# Patient Record
Sex: Male | Born: 1950 | Race: Black or African American | Hispanic: No | Smoking: Former smoker
Health system: Southern US, Community
[De-identification: ages and names within clinical notes are randomized; demographics above are authoritative.]

## PROBLEM LIST (undated history)

## (undated) DIAGNOSIS — I639 Cerebral infarction, unspecified: Secondary | ICD-10-CM

## (undated) DIAGNOSIS — E119 Type 2 diabetes mellitus without complications: Secondary | ICD-10-CM

## (undated) DIAGNOSIS — I10 Essential (primary) hypertension: Secondary | ICD-10-CM

---

## 2016-08-05 ENCOUNTER — Emergency Department (HOSPITAL_COMMUNITY): Payer: Medicare Other

## 2016-08-05 ENCOUNTER — Observation Stay (HOSPITAL_COMMUNITY)
Admission: EM | Admit: 2016-08-05 | Discharge: 2016-08-06 | Disposition: A | Discharge status: Home / Self Care | Payer: Medicare Other | Source: Home / Self Care | Attending: Emergency Medicine | Admitting: Emergency Medicine

## 2016-08-05 DIAGNOSIS — N189 Chronic kidney disease, unspecified: Secondary | ICD-10-CM | POA: Diagnosis present

## 2016-08-05 DIAGNOSIS — I639 Cerebral infarction, unspecified: Secondary | ICD-10-CM

## 2016-08-05 DIAGNOSIS — Z87891 Personal history of nicotine dependence: Secondary | ICD-10-CM

## 2016-08-05 DIAGNOSIS — R4701 Aphasia: Secondary | ICD-10-CM

## 2016-08-05 DIAGNOSIS — R29810 Facial weakness: Secondary | ICD-10-CM | POA: Diagnosis present

## 2016-08-05 DIAGNOSIS — D631 Anemia in chronic kidney disease: Secondary | ICD-10-CM | POA: Diagnosis present

## 2016-08-05 DIAGNOSIS — G9341 Metabolic encephalopathy: Secondary | ICD-10-CM | POA: Diagnosis present

## 2016-08-05 DIAGNOSIS — Z79899 Other long term (current) drug therapy: Secondary | ICD-10-CM

## 2016-08-05 DIAGNOSIS — R0602 Shortness of breath: Secondary | ICD-10-CM

## 2016-08-05 DIAGNOSIS — E1122 Type 2 diabetes mellitus with diabetic chronic kidney disease: Secondary | ICD-10-CM | POA: Diagnosis present

## 2016-08-05 DIAGNOSIS — E785 Hyperlipidemia, unspecified: Secondary | ICD-10-CM | POA: Diagnosis present

## 2016-08-05 DIAGNOSIS — I509 Heart failure, unspecified: Secondary | ICD-10-CM | POA: Diagnosis present

## 2016-08-05 DIAGNOSIS — I69351 Hemiplegia and hemiparesis following cerebral infarction affecting right dominant side: Secondary | ICD-10-CM

## 2016-08-05 DIAGNOSIS — I130 Hypertensive heart and chronic kidney disease with heart failure and stage 1 through stage 4 chronic kidney disease, or unspecified chronic kidney disease: Secondary | ICD-10-CM | POA: Diagnosis present

## 2016-08-05 DIAGNOSIS — G8194 Hemiplegia, unspecified affecting left nondominant side: Secondary | ICD-10-CM | POA: Diagnosis present

## 2016-08-05 DIAGNOSIS — D72829 Elevated white blood cell count, unspecified: Secondary | ICD-10-CM | POA: Diagnosis present

## 2016-08-05 DIAGNOSIS — E1165 Type 2 diabetes mellitus with hyperglycemia: Secondary | ICD-10-CM | POA: Diagnosis present

## 2016-08-05 HISTORY — DX: Cerebral infarction, unspecified: I63.9

## 2016-08-05 LAB — I-STAT CHEM 8, ED
BUN: 28 mg/dL — ABNORMAL HIGH (ref 6–20)
CALCIUM ION: 1.14 mmol/L — AB (ref 1.15–1.40)
Chloride: 106 mmol/L (ref 101–111)
Creatinine, Ser: 1.4 mg/dL — ABNORMAL HIGH (ref 0.61–1.24)
Glucose, Bld: 114 mg/dL — ABNORMAL HIGH (ref 65–99)
HCT: 28 % — ABNORMAL LOW (ref 39.0–52.0)
HEMOGLOBIN: 9.5 g/dL — AB (ref 13.0–17.0)
POTASSIUM: 4.4 mmol/L (ref 3.5–5.1)
Sodium: 140 mmol/L (ref 135–145)
TCO2: 25 mmol/L (ref 0–100)

## 2016-08-05 LAB — CBG MONITORING, ED: Glucose-Capillary: 107 mg/dL — ABNORMAL HIGH (ref 65–99)

## 2016-08-05 MED ORDER — IOPAMIDOL (ISOVUE-370) INJECTION 76%
INTRAVENOUS | Status: DC
Start: 2016-08-05 — End: 2016-08-06
  Filled 2016-08-05: qty 50

## 2016-08-05 NOTE — ED Provider Notes (Addendum)
[facility]-EMERGENCY DEPT Provider Note   CSN: [id number] Arrival date & time: 08/05/16  2344  By signing my name below, I, Elder Negus, attest that this documentation has been prepared under the direction and in the presence of Geoffery Lyons, MD. Electronically Signed: Elder Negus, Scribe. 08/05/16. 11:58 PM.   History   Chief Complaint No chief complaint on file.  LEVEL 5 CAVEAT DUE TO APHASIA  HPI Davis Ambrosini is a 66 y.o. male with history of R sided hemiparesis at baseline who presents to the ED as a CODE STROKE from skilled nursing facility. According to medic report, this patient first developed "drooling" at his nursing facility at 1700. At 2230 he also had onset of speech problem as well as L arm weakness and L sided facial droop according to nursing staff. Medics state that the patient is able to answer yes/no questions; not his baseline. Also they do not visualize a L sided facial droop.   A complete history is limited by altered mental status.   The history is provided by the nursing home and the EMS personnel. No language interpreter was used.    No past medical history on file.  There are no active problems to display for this patient.   No past surgical history on file.     Home Medications    Prior to Admission medications   Not on File    Family History No family history on file.  Social History Social History  Substance Use Topics  . Smoking status: Not on file  . Smokeless tobacco: Not on file  . Alcohol use Not on file     Allergies   Patient has no allergy information on record.   Review of Systems Review of Systems A complete review of systems is unattainable due to altered mental status.   Physical Exam Updated Vital Signs There were no vitals taken for this visit.  Physical Exam   ED Treatments / Results  Labs (all labs ordered are listed, but only abnormal results are displayed) Labs Reviewed  CBG MONITORING, ED -  Abnormal; Notable for the following:       Result Value   Glucose-Capillary 107 (*)    All other components within normal limits  PROTIME-INR  APTT  CBC  DIFFERENTIAL  COMPREHENSIVE METABOLIC PANEL  I-STAT TROPOININ, ED  I-STAT CHEM 8, ED    EKG  EKG Interpretation  Date/Time:  Thursday August 06 2016 00:24:49 EDT Ventricular Rate:  100 PR Interval:    QRS Duration: 100 QT Interval:  361 QTC Calculation: 466 R Axis:   70 Text Interpretation:  Sinus tachycardia Inferior infarct, old Confirmed by Mohid Furuya  MD, Jaiveon Suppes ([id number]) on 08/06/2016 12:38:02 AM       Radiology No results found.  Procedures Procedures (including critical care time)  Medications Ordered in ED Medications - No data to display   Initial Impression / Assessment and Plan / ED Course  I have reviewed the triage vital signs and the nursing notes.  Pertinent labs & imaging results that were available during my care of the patient were reviewed by me and considered in my medical decision making (see chart for details).  Patient is a 66 year old male with past medical history of prior CVA with resultant right-sided hemiparesis. He presents today for evaluation of slurred speech and left arm weakness. He is currently a resident of a skilled nursing facility and this is where he was when the symptoms began. This was noted just prior to  arrival.  The patient arrived here as a code stroke due to the nature of symptoms and the timing of onset. He was screened upon arrival, then sent for a stat head CT. This was performed and revealed prior CVAs, however nothing obviously acute. He was also evaluated by neurology who wanted additional perfusion studies which were performed. Neurology is recommending admission to the hospitalist service for further workup of what appears to be a small CVA. Dr. Maryfrances Bunnell to admit.  Final Clinical Impressions(s) / ED Diagnoses   Final diagnoses:  None    New Prescriptions New  Prescriptions   No medications on file     Geoffery Lyons, MD 08/06/16 1610    Geoffery Lyons, MD 09/24/16 [id number]

## 2016-08-06 ENCOUNTER — Inpatient Hospital Stay (HOSPITAL_COMMUNITY)
Admission: EM | Admit: 2016-08-06 | Discharge: 2016-08-11 | DRG: 064 | Disposition: A | Discharge status: Skilled Nursing Facility | Payer: Medicare Other | Attending: Family Medicine | Admitting: Family Medicine

## 2016-08-06 ENCOUNTER — Observation Stay (HOSPITAL_COMMUNITY): Payer: Medicare Other

## 2016-08-06 ENCOUNTER — Encounter (HOSPITAL_COMMUNITY): Payer: Self-pay

## 2016-08-06 ENCOUNTER — Emergency Department (HOSPITAL_COMMUNITY): Payer: Medicare Other

## 2016-08-06 DIAGNOSIS — E784 Other hyperlipidemia: Secondary | ICD-10-CM | POA: Diagnosis not present

## 2016-08-06 DIAGNOSIS — I69351 Hemiplegia and hemiparesis following cerebral infarction affecting right dominant side: Secondary | ICD-10-CM | POA: Diagnosis not present

## 2016-08-06 DIAGNOSIS — Z79899 Other long term (current) drug therapy: Secondary | ICD-10-CM | POA: Diagnosis not present

## 2016-08-06 DIAGNOSIS — I639 Cerebral infarction, unspecified: Principal | ICD-10-CM

## 2016-08-06 DIAGNOSIS — I130 Hypertensive heart and chronic kidney disease with heart failure and stage 1 through stage 4 chronic kidney disease, or unspecified chronic kidney disease: Secondary | ICD-10-CM | POA: Diagnosis present

## 2016-08-06 DIAGNOSIS — G9341 Metabolic encephalopathy: Secondary | ICD-10-CM | POA: Diagnosis present

## 2016-08-06 DIAGNOSIS — I6312 Cerebral infarction due to embolism of basilar artery: Secondary | ICD-10-CM

## 2016-08-06 DIAGNOSIS — N179 Acute kidney failure, unspecified: Secondary | ICD-10-CM

## 2016-08-06 DIAGNOSIS — E785 Hyperlipidemia, unspecified: Secondary | ICD-10-CM

## 2016-08-06 DIAGNOSIS — I509 Heart failure, unspecified: Secondary | ICD-10-CM | POA: Diagnosis present

## 2016-08-06 DIAGNOSIS — R05 Cough: Secondary | ICD-10-CM

## 2016-08-06 DIAGNOSIS — I69359 Hemiplegia and hemiparesis following cerebral infarction affecting unspecified side: Secondary | ICD-10-CM

## 2016-08-06 DIAGNOSIS — D631 Anemia in chronic kidney disease: Secondary | ICD-10-CM | POA: Diagnosis present

## 2016-08-06 DIAGNOSIS — I6300 Cerebral infarction due to thrombosis of unspecified precerebral artery: Secondary | ICD-10-CM | POA: Diagnosis not present

## 2016-08-06 DIAGNOSIS — N189 Chronic kidney disease, unspecified: Secondary | ICD-10-CM

## 2016-08-06 DIAGNOSIS — E1165 Type 2 diabetes mellitus with hyperglycemia: Secondary | ICD-10-CM | POA: Diagnosis present

## 2016-08-06 DIAGNOSIS — R4701 Aphasia: Secondary | ICD-10-CM | POA: Diagnosis present

## 2016-08-06 DIAGNOSIS — I6789 Other cerebrovascular disease: Secondary | ICD-10-CM | POA: Diagnosis not present

## 2016-08-06 DIAGNOSIS — R1110 Vomiting, unspecified: Secondary | ICD-10-CM

## 2016-08-06 DIAGNOSIS — R059 Cough, unspecified: Secondary | ICD-10-CM

## 2016-08-06 DIAGNOSIS — Z87891 Personal history of nicotine dependence: Secondary | ICD-10-CM | POA: Diagnosis not present

## 2016-08-06 DIAGNOSIS — G8194 Hemiplegia, unspecified affecting left nondominant side: Secondary | ICD-10-CM | POA: Diagnosis present

## 2016-08-06 DIAGNOSIS — D72829 Elevated white blood cell count, unspecified: Secondary | ICD-10-CM | POA: Diagnosis present

## 2016-08-06 DIAGNOSIS — E1122 Type 2 diabetes mellitus with diabetic chronic kidney disease: Secondary | ICD-10-CM | POA: Diagnosis present

## 2016-08-06 DIAGNOSIS — R29810 Facial weakness: Secondary | ICD-10-CM | POA: Diagnosis present

## 2016-08-06 DIAGNOSIS — N289 Disorder of kidney and ureter, unspecified: Secondary | ICD-10-CM

## 2016-08-06 HISTORY — DX: Essential (primary) hypertension: I10

## 2016-08-06 HISTORY — DX: Cerebral infarction, unspecified: I63.9

## 2016-08-06 HISTORY — DX: Type 2 diabetes mellitus without complications: E11.9

## 2016-08-06 LAB — DIFFERENTIAL
Basophils Absolute: 0 10*3/uL (ref 0.0–0.1)
Basophils Relative: 0 %
Eosinophils Absolute: 0.4 10*3/uL (ref 0.0–0.7)
Eosinophils Relative: 2 %
Lymphocytes Relative: 12 %
Lymphs Abs: 1.9 10*3/uL (ref 0.7–4.0)
MONO ABS: 1.3 10*3/uL — AB (ref 0.1–1.0)
Monocytes Relative: 9 %
NEUTROS ABS: 11.9 10*3/uL — AB (ref 1.7–7.7)
Neutrophils Relative %: 77 %

## 2016-08-06 LAB — CBC
HCT: 29.3 % — ABNORMAL LOW (ref 39.0–52.0)
Hemoglobin: 9.2 g/dL — ABNORMAL LOW (ref 13.0–17.0)
MCH: 26.6 pg (ref 26.0–34.0)
MCHC: 31.4 g/dL (ref 30.0–36.0)
MCV: 84.7 fL (ref 78.0–100.0)
PLATELETS: 300 10*3/uL (ref 150–400)
RBC: 3.46 MIL/uL — AB (ref 4.22–5.81)
RDW: 15 % (ref 11.5–15.5)
WBC: 15.5 10*3/uL — AB (ref 4.0–10.5)

## 2016-08-06 LAB — PROTIME-INR
INR: 1.14
PROTHROMBIN TIME: 14.7 s (ref 11.4–15.2)

## 2016-08-06 LAB — COMPREHENSIVE METABOLIC PANEL
ALBUMIN: 3.4 g/dL — AB (ref 3.5–5.0)
ALK PHOS: 57 U/L (ref 38–126)
ALT: 13 U/L — AB (ref 17–63)
AST: 19 U/L (ref 15–41)
Anion gap: 8 (ref 5–15)
BUN: 26 mg/dL — ABNORMAL HIGH (ref 6–20)
CALCIUM: 9.2 mg/dL (ref 8.9–10.3)
CO2: 23 mmol/L (ref 22–32)
CREATININE: 1.4 mg/dL — AB (ref 0.61–1.24)
Chloride: 106 mmol/L (ref 101–111)
GFR calc non Af Amer: 54 mL/min — ABNORMAL LOW (ref >60)
GLUCOSE: 115 mg/dL — AB (ref 65–99)
Potassium: 4.4 mmol/L (ref 3.5–5.1)
SODIUM: 137 mmol/L (ref 135–145)
Total Bilirubin: 0.5 mg/dL (ref 0.3–1.2)
Total Protein: 7.5 g/dL (ref 6.5–8.1)

## 2016-08-06 LAB — I-STAT CG4 LACTIC ACID, ED: Lactic Acid, Venous: 1.48 mmol/L (ref 0.5–1.9)

## 2016-08-06 LAB — I-STAT TROPONIN, ED: Troponin i, poc: 0 ng/mL (ref 0.00–0.08)

## 2016-08-06 LAB — APTT: aPTT: 42 seconds — ABNORMAL HIGH (ref 24–36)

## 2016-08-06 LAB — MRSA PCR SCREENING: MRSA by PCR: NEGATIVE

## 2016-08-06 MED ORDER — SODIUM CHLORIDE 0.9 % IV SOLN: INTRAVENOUS | Status: DC

## 2016-08-06 MED ORDER — ACETAMINOPHEN 650 MG RE SUPP
650.0000 mg | RECTAL | Status: DC | PRN
  Administered 2016-08-09: 650 mg via RECTAL
  Filled 2016-08-06: qty 1

## 2016-08-06 MED ORDER — ACETAMINOPHEN 325 MG PO TABS: 650.0000 mg | ORAL_TABLET | ORAL | Status: DC | PRN

## 2016-08-06 MED ORDER — IOPAMIDOL (ISOVUE-370) INJECTION 76%
INTRAVENOUS | Status: AC
  Administered 2016-08-06: 100 mL
  Filled 2016-08-06: qty 50

## 2016-08-06 MED ORDER — SENNOSIDES-DOCUSATE SODIUM 8.6-50 MG PO TABS
1.0000 | ORAL_TABLET | Freq: Every evening | ORAL | Status: DC | PRN
  Filled 2016-08-06: qty 1

## 2016-08-06 MED ORDER — ENOXAPARIN SODIUM 40 MG/0.4ML ~~LOC~~ SOLN: 40.0000 mg | SUBCUTANEOUS | Status: DC

## 2016-08-06 MED ORDER — CHLORHEXIDINE GLUCONATE 0.12 % MT SOLN
15.0000 mL | Freq: Two times a day (BID) | OROMUCOSAL | Status: DC
  Administered 2016-08-07 – 2016-08-11 (×9): 15 mL via OROMUCOSAL
  Filled 2016-08-06 (×7): qty 15

## 2016-08-06 MED ORDER — ACETAMINOPHEN 160 MG/5ML PO SOLN: 650.0000 mg | ORAL | Status: DC | PRN

## 2016-08-06 MED ORDER — ASPIRIN 325 MG PO TABS: 325.0000 mg | ORAL_TABLET | Freq: Every day | ORAL | Status: DC

## 2016-08-06 MED ORDER — ASPIRIN 300 MG RE SUPP: 300.0000 mg | Freq: Every day | RECTAL | Status: DC

## 2016-08-06 MED ORDER — ACETAMINOPHEN 650 MG RE SUPP: 650.0000 mg | RECTAL | Status: DC | PRN

## 2016-08-06 MED ORDER — STROKE: EARLY STAGES OF RECOVERY BOOK
Freq: Once | Status: AC
  Administered 2016-08-06: 10:00:00
  Filled 2016-08-06: qty 1

## 2016-08-06 MED ORDER — ORAL CARE MOUTH RINSE
15.0000 mL | Freq: Two times a day (BID) | OROMUCOSAL | Status: DC
  Administered 2016-08-06 – 2016-08-07 (×3): 15 mL via OROMUCOSAL

## 2016-08-06 MED ORDER — STROKE: EARLY STAGES OF RECOVERY BOOK
Freq: Once | Status: DC
  Filled 2016-08-06: qty 1

## 2016-08-06 NOTE — H&P (Addendum)
History and Physical    Malik Bruce ZOX:[id number] DOB: 02-14-1951 DOA: 08/06/2016  Referring MD/NP/PA: Dr. Judd Lien PCP: No primary care provider on file.  Patient coming from: [facility] via EMS  Chief Complaint: Stroke symptoms  HPI: Malik Bruce is a 66 y.o. male with medical history significant of CVA with residual right-sided hemiparesis, DM type II, and HTN; who presented as a code stroke from [facility] nursing facility. History is obtained mostly from the airport and talks with the patient's brother is present at bedside. The patient has not been able to communicate at this time. Patient had been reported to have been found drooling yesterday around 5 PM. A few hours patient was noted have worsening speech with left-sided weakness and facial droop. Patient's mother notes that at baseline his speech is difficult to understand, but family has noticed a decline over the last 2 weeks in their ability to understand things that he stay.   ED Course: Upon admission into the emergency department patient was seen by neurology as a code stroke. Initial imaging studies including CT and CT angiogram showed junky calcification within the left internal carotid artery resulting 60% stenosis, extra-axial contrast-enhancing mass suggestive of meningioma, but no acute signs of stroke. Neurology recommended TRH admit her further stroke workup. Patient was out of the TPA time window.   Review of Systems: Unable to obtain as patient unable to communicate.  Past Medical History:  Diagnosis Date  . CVA (cerebral vascular accident) (HCC)   . DM type 2 (diabetes mellitus, type 2) (HCC)   . HTN (hypertension)     History reviewed. No pertinent surgical history.   reports that he has quit smoking. He does not have any smokeless tobacco history on file. His alcohol and drug histories are not on file.  No Known Allergies  Family history reviewed.  Prior to Admission medications   Not on File     Physical Exam: Constitutional: Elderly male who is lethargic and minimally responsive to verbal stimuli Vitals:   08/06/16 0530 08/06/16 0700  BP: 132/72 (!) 104/48  Pulse: 90 86  Resp:  (!) 9  Temp: 98.9 F (37.2 C) 97.6 F (36.4 C)  TempSrc: Oral Oral  SpO2: 98% 98%  Weight: 74.6 kg (164 lb 7.4 oz)   Height: 6' (1.829 m)    Eyes: Patient unable to track with right eye. ENMT: Mucous membranes are dry. Posterior pharynx clear of any exudate or lesions. Neck: normal, supple, no masses, no thyromegaly Respiratory: clear to auscultation bilaterally, no wheezing. Bibasilar crackles. Mildly depressed respiratory effort. No accessory muscle use.  Cardiovascular: Regular rate and rhythm, no murmurs / rubs / gallops. No extremity edema. 2+ pedal pulses. No carotid bruits.  Abdomen: no tenderness, no masses palpated. No hepatosplenomegaly. Bowel sounds positive.  Musculoskeletal: no clubbing / cyanosis. No joint deformity upper and lower extremities. Good ROM, no contractures. Normal muscle tone.  Skin: no rashes, lesions, ulcers. No induration Neurologic: CN 2-12 grossly intact. right sided hemiparesis with right-sided facial droop. New left sided weakness 3/5. Dysarthric speech. Psychiatric: Unable to assess as patient lethargic   Labs on Admission: I have personally reviewed following labs and imaging studies  CBC:  Recent Labs Lab 08/05/16 2346 08/05/16 2356  WBC 15.5*  --   NEUTROABS 11.9*  --   HGB 9.2* 9.5*  HCT 29.3* 28.0*  MCV 84.7  --   PLT 300  --    Basic Metabolic Panel:  Recent Labs Lab 08/05/16  2346 08/05/16 2356  NA 137 140  K 4.4 4.4  CL 106 106  CO2 23  --   GLUCOSE 115* 114*  BUN 26* 28*  CREATININE 1.40* 1.40*  CALCIUM 9.2  --    GFR: Estimated Creatinine Clearance: 54 mL/min (A) (by C-G formula based on SCr of 1.4 mg/dL (H)). Liver Function Tests:  Recent Labs Lab 08/05/16 2346  AST 19  ALT 13*  ALKPHOS 57  BILITOT 0.5  PROT 7.5   ALBUMIN 3.4*   No results for input(s): LIPASE, AMYLASE in the last 168 hours. No results for input(s): AMMONIA in the last 168 hours. Coagulation Profile:  Recent Labs Lab 08/05/16 2346  INR 1.14   Cardiac Enzymes: No results for input(s): CKTOTAL, CKMB, CKMBINDEX, TROPONINI in the last 168 hours. BNP (last 3 results) No results for input(s): PROBNP in the last 8760 hours. HbA1C: No results for input(s): HGBA1C in the last 72 hours. CBG:  Recent Labs Lab 08/05/16 2349  GLUCAP 107*   Lipid Profile: No results for input(s): CHOL, HDL, LDLCALC, TRIG, CHOLHDL, LDLDIRECT in the last 72 hours. Thyroid Function Tests: No results for input(s): TSH, T4TOTAL, FREET4, T3FREE, THYROIDAB in the last 72 hours. Anemia Panel: No results for input(s): VITAMINB12, FOLATE, FERRITIN, TIBC, IRON, RETICCTPCT in the last 72 hours. Urine analysis: No results found for: COLORURINE, APPEARANCEUR, LABSPEC, PHURINE, GLUCOSEU, HGBUR, BILIRUBINUR, KETONESUR, PROTEINUR, UROBILINOGEN, NITRITE, LEUKOCYTESUR Sepsis Labs: No results found for this or any previous visit (from the past 240 hour(s)).   Radiological Exams on Admission: Ct Angio Head W Or Wo Contrast  Result Date: 08/06/2016 CLINICAL DATA:  Left-sided weakness and dysphasia EXAM: CT ANGIOGRAPHY HEAD AND NECK CT PERFUSION BRAIN TECHNIQUE: Multidetector CT imaging of the head and neck was performed using the standard protocol during bolus administration of intravenous contrast. Multiplanar CT image reconstructions and MIPs were obtained to evaluate the vascular anatomy. Carotid stenosis measurements (when applicable) are obtained utilizing NASCET criteria, using the distal internal carotid diameter as the denominator. Multiphase CT imaging of the brain was performed following IV bolus contrast injection. Subsequent parametric perfusion maps were calculated using RAPID software. CONTRAST:  100 mL Isovue 370 IV COMPARISON:  None. FINDINGS: CTA NECK  FINDINGS Aortic arch: There is no aneurysm or dissection of the visualized ascending aorta or aortic arch. There is a normal 3 vessel branching pattern. The visualized proximal subclavian arteries are normal. Right carotid system: The right common carotid origin is widely patent. There is no common carotid or internal carotid artery dissection or aneurysm. There is mixed calcified and noncalcified plaque at the bifurcation but no hemodynamically significant stenosis. Left carotid system: There is a large calcification along the medial aspect of the left common carotid lumen there results in approximately 60% stenosis. There is no common carotid or internal carotid artery dissection or aneurysm. There is atherosclerotic calcification of the bifurcation but no hemodynamically significant stenosis. Vertebral arteries: The vertebral system is codominant. There is moderate narrowing of both vertebral artery origins secondary to atherosclerotic calcification. There is multifocal atherosclerosis within both V1 segments. The V2 and V3 segments are normal. There is minimal calcification in the left V4 segment. Skeleton: There is no bony spinal canal stenosis. No lytic or blastic lesions. Other neck: The nasopharynx is clear. The oropharynx and hypopharynx are normal. The epiglottis is normal. The supraglottic larynx, glottis and subglottic larynx are normal. No retropharyngeal collection. The parapharyngeal spaces are preserved. The parotid and submandibular glands are normal. No sialolithiasis or salivary  ductal dilatation. The thyroid gland is normal. There is no cervical lymphadenopathy. Upper chest: No pneumothorax or pleural effusion. No nodules or masses. Review of the MIP images confirms the above findings CTA HEAD FINDINGS Anterior circulation: --Intracranial internal carotid arteries: Mild atherosclerotic calcification without hemodynamically significant stenosis. --Anterior cerebral arteries: Normal. --Middle  cerebral arteries: Normal. --Posterior communicating arteries: Present bilaterally Posterior circulation: --Posterior cerebral arteries: Normal. --Superior cerebellar arteries: Normal. --Basilar artery: Normal. --Anterior inferior cerebellar arteries: Normal. --Posterior inferior cerebellar arteries: Normal. Venous sinuses: As permitted by contrast timing, patent. Anatomic variants: Fetal origin of the left vertebral artery. Other findings: There is a superior left convexity extra-axial contrast-enhancing mass that measures 13 x 10 mm. Review of the MIP images confirms the above findings CT Brain Perfusion Findings: CBF (<30%) Volume: 0mL Perfusion (Tmax>6.0s) volume: 0mL Mismatch Volume: 0mL Infarction Location:None IMPRESSION: 1. Normal perfusion scan of the brain. 2. No intracranial or cervical large vessel occlusion. 3. Right carotid bifurcation atherosclerosis without hemodynamically significant stenosis. Chunky calcification within the left common carotid artery, resulting in 60% stenosis. 4. Bilateral moderate narrowing of the vertebral artery origins due to atherosclerotic calcification. Otherwise normal vertebral arteries. 5. Extra-axial contrast-enhancing mass at the superior left convexity, likely meningioma. Electronically Signed   By: Deatra Robinson M.D.   On: 08/06/2016 00:42   Ct Angio Neck W Or Wo Contrast  Result Date: 08/06/2016 CLINICAL DATA:  Left-sided weakness and dysphasia EXAM: CT ANGIOGRAPHY HEAD AND NECK CT PERFUSION BRAIN TECHNIQUE: Multidetector CT imaging of the head and neck was performed using the standard protocol during bolus administration of intravenous contrast. Multiplanar CT image reconstructions and MIPs were obtained to evaluate the vascular anatomy. Carotid stenosis measurements (when applicable) are obtained utilizing NASCET criteria, using the distal internal carotid diameter as the denominator. Multiphase CT imaging of the brain was performed following IV bolus contrast  injection. Subsequent parametric perfusion maps were calculated using RAPID software. CONTRAST:  100 mL Isovue 370 IV COMPARISON:  None. FINDINGS: CTA NECK FINDINGS Aortic arch: There is no aneurysm or dissection of the visualized ascending aorta or aortic arch. There is a normal 3 vessel branching pattern. The visualized proximal subclavian arteries are normal. Right carotid system: The right common carotid origin is widely patent. There is no common carotid or internal carotid artery dissection or aneurysm. There is mixed calcified and noncalcified plaque at the bifurcation but no hemodynamically significant stenosis. Left carotid system: There is a large calcification along the medial aspect of the left common carotid lumen there results in approximately 60% stenosis. There is no common carotid or internal carotid artery dissection or aneurysm. There is atherosclerotic calcification of the bifurcation but no hemodynamically significant stenosis. Vertebral arteries: The vertebral system is codominant. There is moderate narrowing of both vertebral artery origins secondary to atherosclerotic calcification. There is multifocal atherosclerosis within both V1 segments. The V2 and V3 segments are normal. There is minimal calcification in the left V4 segment. Skeleton: There is no bony spinal canal stenosis. No lytic or blastic lesions. Other neck: The nasopharynx is clear. The oropharynx and hypopharynx are normal. The epiglottis is normal. The supraglottic larynx, glottis and subglottic larynx are normal. No retropharyngeal collection. The parapharyngeal spaces are preserved. The parotid and submandibular glands are normal. No sialolithiasis or salivary ductal dilatation. The thyroid gland is normal. There is no cervical lymphadenopathy. Upper chest: No pneumothorax or pleural effusion. No nodules or masses. Review of the MIP images confirms the above findings CTA HEAD FINDINGS Anterior circulation: --Intracranial  internal carotid arteries: Mild atherosclerotic calcification without hemodynamically significant stenosis. --Anterior cerebral arteries: Normal. --Middle cerebral arteries: Normal. --Posterior communicating arteries: Present bilaterally Posterior circulation: --Posterior cerebral arteries: Normal. --Superior cerebellar arteries: Normal. --Basilar artery: Normal. --Anterior inferior cerebellar arteries: Normal. --Posterior inferior cerebellar arteries: Normal. Venous sinuses: As permitted by contrast timing, patent. Anatomic variants: Fetal origin of the left vertebral artery. Other findings: There is a superior left convexity extra-axial contrast-enhancing mass that measures 13 x 10 mm. Review of the MIP images confirms the above findings CT Brain Perfusion Findings: CBF (<30%) Volume: 0mL Perfusion (Tmax>6.0s) volume: 0mL Mismatch Volume: 0mL Infarction Location:None IMPRESSION: 1. Normal perfusion scan of the brain. 2. No intracranial or cervical large vessel occlusion. 3. Right carotid bifurcation atherosclerosis without hemodynamically significant stenosis. Chunky calcification within the left common carotid artery, resulting in 60% stenosis. 4. Bilateral moderate narrowing of the vertebral artery origins due to atherosclerotic calcification. Otherwise normal vertebral arteries. 5. Extra-axial contrast-enhancing mass at the superior left convexity, likely meningioma. Electronically Signed   By: Deatra Robinson M.D.   On: 08/06/2016 00:42   Mr Brain Wo Contrast  Result Date: 08/06/2016 CLINICAL DATA:  A aphasia and left-sided weakness. Left facial droop. EXAM: MRI HEAD WITHOUT CONTRAST MRA HEAD WITHOUT CONTRAST TECHNIQUE: Multiplanar, multiecho pulse sequences of the brain and surrounding structures were obtained without intravenous contrast. Angiographic images of the head were obtained using MRA technique without contrast. COMPARISON:  Head CT 08/06/2016 FINDINGS: MRI HEAD FINDINGS Brain: The midline  structures are normal. There is a small focus of diffusion restriction measuring approximately 8 mm within the right pons. No other areas of diffusion restriction. There is encephalomalacia at the site of old left MCA infarct. There are multiple old bilateral lacunar infarcts. There is a large amount of hyperintense T2 weighted signal within the left frontoparietal white matter. There is also a small old right cerebellar infarct. There is a punctate focus of petechial hemorrhage within the right pons. There is hemosiderin deposition in the left MCA distribution. No mass lesion. No chronic microhemorrhage or cerebral amyloid angiopathy. No hydrocephalus, age advanced atrophy or lobar predominant volume loss. No dural abnormality or extra-axial collection. Skull and upper cervical spine: The visualized skull base, calvarium, upper cervical spine and extracranial soft tissues are normal. Sinuses/Orbits: No fluid levels or advanced mucosal thickening. No mastoid effusion. Normal orbits. MRA HEAD FINDINGS Intracranial internal carotid arteries: Normal. Anterior cerebral arteries: Normal. Middle cerebral arteries: Normal. Posterior communicating arteries: Present bilaterally. Posterior cerebral arteries: Normal.  Fetal origin on the left. Basilar artery: Normal. Vertebral arteries: Codominant. Normal. Superior cerebellar arteries: Normal. Anterior inferior cerebellar arteries: Normal. Posterior inferior cerebellar arteries: Normal. IMPRESSION: 1. Acute infarct within the right pons, in keeping with left-sided weakness. Punctate focus of nearby microhemorrhage seen on gradient echo imaging is suspected to be chronic. Regardless, no space-occupying hematoma. 2. Multiple old infarcts, including large left MCA distribution infarct, with associated encephalomalacia. 3. Normal intracranial MRA. Electronically Signed   By: Deatra Robinson M.D.   On: 08/06/2016 05:19   Ct Cerebral Perfusion W Contrast  Result Date:  08/06/2016 CLINICAL DATA:  Left-sided weakness and dysphasia EXAM: CT ANGIOGRAPHY HEAD AND NECK CT PERFUSION BRAIN TECHNIQUE: Multidetector CT imaging of the head and neck was performed using the standard protocol during bolus administration of intravenous contrast. Multiplanar CT image reconstructions and MIPs were obtained to evaluate the vascular anatomy. Carotid stenosis measurements (when applicable) are obtained utilizing NASCET criteria, using the distal internal carotid diameter as the denominator. Multiphase  CT imaging of the brain was performed following IV bolus contrast injection. Subsequent parametric perfusion maps were calculated using RAPID software. CONTRAST:  100 mL Isovue 370 IV COMPARISON:  None. FINDINGS: CTA NECK FINDINGS Aortic arch: There is no aneurysm or dissection of the visualized ascending aorta or aortic arch. There is a normal 3 vessel branching pattern. The visualized proximal subclavian arteries are normal. Right carotid system: The right common carotid origin is widely patent. There is no common carotid or internal carotid artery dissection or aneurysm. There is mixed calcified and noncalcified plaque at the bifurcation but no hemodynamically significant stenosis. Left carotid system: There is a large calcification along the medial aspect of the left common carotid lumen there results in approximately 60% stenosis. There is no common carotid or internal carotid artery dissection or aneurysm. There is atherosclerotic calcification of the bifurcation but no hemodynamically significant stenosis. Vertebral arteries: The vertebral system is codominant. There is moderate narrowing of both vertebral artery origins secondary to atherosclerotic calcification. There is multifocal atherosclerosis within both V1 segments. The V2 and V3 segments are normal. There is minimal calcification in the left V4 segment. Skeleton: There is no bony spinal canal stenosis. No lytic or blastic lesions. Other  neck: The nasopharynx is clear. The oropharynx and hypopharynx are normal. The epiglottis is normal. The supraglottic larynx, glottis and subglottic larynx are normal. No retropharyngeal collection. The parapharyngeal spaces are preserved. The parotid and submandibular glands are normal. No sialolithiasis or salivary ductal dilatation. The thyroid gland is normal. There is no cervical lymphadenopathy. Upper chest: No pneumothorax or pleural effusion. No nodules or masses. Review of the MIP images confirms the above findings CTA HEAD FINDINGS Anterior circulation: --Intracranial internal carotid arteries: Mild atherosclerotic calcification without hemodynamically significant stenosis. --Anterior cerebral arteries: Normal. --Middle cerebral arteries: Normal. --Posterior communicating arteries: Present bilaterally Posterior circulation: --Posterior cerebral arteries: Normal. --Superior cerebellar arteries: Normal. --Basilar artery: Normal. --Anterior inferior cerebellar arteries: Normal. --Posterior inferior cerebellar arteries: Normal. Venous sinuses: As permitted by contrast timing, patent. Anatomic variants: Fetal origin of the left vertebral artery. Other findings: There is a superior left convexity extra-axial contrast-enhancing mass that measures 13 x 10 mm. Review of the MIP images confirms the above findings CT Brain Perfusion Findings: CBF (<30%) Volume: 0mL Perfusion (Tmax>6.0s) volume: 0mL Mismatch Volume: 0mL Infarction Location:None IMPRESSION: 1. Normal perfusion scan of the brain. 2. No intracranial or cervical large vessel occlusion. 3. Right carotid bifurcation atherosclerosis without hemodynamically significant stenosis. Chunky calcification within the left common carotid artery, resulting in 60% stenosis. 4. Bilateral moderate narrowing of the vertebral artery origins due to atherosclerotic calcification. Otherwise normal vertebral arteries. 5. Extra-axial contrast-enhancing mass at the superior left  convexity, likely meningioma. Electronically Signed   By: Deatra Robinson M.D.   On: 08/06/2016 00:42   Dg Chest Port 1 View  Result Date: 08/06/2016 CLINICAL DATA:  Dyspnea this evening EXAM: PORTABLE CHEST 1 VIEW COMPARISON:  None. FINDINGS: Borderline cardiomegaly. No aortic aneurysm. Pulmonary vascular congestion consistent with mild CHF. Probable small pleural effusions. No acute nor suspicious osseous abnormality. Degenerative changes are seen along the dorsal spine and both shoulders. IMPRESSION: Borderline cardiomegaly with mild vascular congestion consistent with CHF. Probable bilateral small pleural effusions. Electronically Signed   By: Tollie Eth M.D.   On: 08/06/2016 02:41   Mr Maxine Glenn Head/brain ZO Cm  Result Date: 08/06/2016 CLINICAL DATA:  A aphasia and left-sided weakness. Left facial droop. EXAM: MRI HEAD WITHOUT CONTRAST MRA HEAD WITHOUT CONTRAST TECHNIQUE: Multiplanar, multiecho  pulse sequences of the brain and surrounding structures were obtained without intravenous contrast. Angiographic images of the head were obtained using MRA technique without contrast. COMPARISON:  Head CT 08/06/2016 FINDINGS: MRI HEAD FINDINGS Brain: The midline structures are normal. There is a small focus of diffusion restriction measuring approximately 8 mm within the right pons. No other areas of diffusion restriction. There is encephalomalacia at the site of old left MCA infarct. There are multiple old bilateral lacunar infarcts. There is a large amount of hyperintense T2 weighted signal within the left frontoparietal white matter. There is also a small old right cerebellar infarct. There is a punctate focus of petechial hemorrhage within the right pons. There is hemosiderin deposition in the left MCA distribution. No mass lesion. No chronic microhemorrhage or cerebral amyloid angiopathy. No hydrocephalus, age advanced atrophy or lobar predominant volume loss. No dural abnormality or extra-axial collection. Skull  and upper cervical spine: The visualized skull base, calvarium, upper cervical spine and extracranial soft tissues are normal. Sinuses/Orbits: No fluid levels or advanced mucosal thickening. No mastoid effusion. Normal orbits. MRA HEAD FINDINGS Intracranial internal carotid arteries: Normal. Anterior cerebral arteries: Normal. Middle cerebral arteries: Normal. Posterior communicating arteries: Present bilaterally. Posterior cerebral arteries: Normal.  Fetal origin on the left. Basilar artery: Normal. Vertebral arteries: Codominant. Normal. Superior cerebellar arteries: Normal. Anterior inferior cerebellar arteries: Normal. Posterior inferior cerebellar arteries: Normal. IMPRESSION: 1. Acute infarct within the right pons, in keeping with left-sided weakness. Punctate focus of nearby microhemorrhage seen on gradient echo imaging is suspected to be chronic. Regardless, no space-occupying hematoma. 2. Multiple old infarcts, including large left MCA distribution infarct, with associated encephalomalacia. 3. Normal intracranial MRA. Electronically Signed   By: Deatra Robinson M.D.   On: 08/06/2016 05:19   Ct Head Code Stroke W/o Cm  Result Date: 08/06/2016 CLINICAL DATA:  Code stroke. Left-sided weakness, drooling and slurred speech EXAM: CT HEAD WITHOUT CONTRAST TECHNIQUE: Contiguous axial images were obtained from the base of the skull through the vertex without intravenous contrast. COMPARISON:  None. FINDINGS: Brain: There is encephalomalacia and the left MCA distribution, likely chronic infarct. There are multiple age-indeterminate lacunar infarcts of the right basal ganglia and adjacent white matter. No midline shift or mass effect. No acute hemorrhage. Vascular: No hyperdense vessel. Atherosclerotic calcification of the vertebral and internal carotid arteries at the skull base. Skull: Normal. Negative for fracture or focal lesion. Sinuses/Orbits: The visualized portions of the paranasal sinuses and mastoid air  cells are free of fluid. No advanced mucosal thickening. The visualized orbits are normal. Other: None ASPECTS (Alberta Stroke Program Early CT Score) - Ganglionic level infarction (caudate, lentiform nuclei, internal capsule, insula, M1-M3 cortex): 6 - Supraganglionic infarction (M4-M6 cortex): 2 Total score (0-10 with 10 being normal): 8 IMPRESSION: 1. Age indeterminate lacunar infarcts of the right basal ganglia and thalamus. MRI is recommended for more definitive temporal characterization. 2. No acute hemorrhage. 3. Multiple old infarcts including the left MCA distribution and left basal ganglia/thalamus. 4. ASPECTS is 8. These results were called by telephone at the time of interpretation on 08/06/2016 at 12:11 am to Dr. Caryl Pina who verbally acknowledged these results. Electronically Signed   By: Deatra Robinson M.D.   On: 08/06/2016 00:11    EKG: Independently reviewed. Sinus tachycardia 106 bpm  Assessment/Plan Suspected CVA /Aphasia with left-sided weakness: Acute. Presents with new onset of left-sided weakness and worsening ability to talk. - Admit to telemetry bed - Stroke order set initiated - Neuro checks - Check  MRI/MRA head w/o contrast  - PT/OT/Speech to eval and treat - Check echocardiogram - Check Hemoglobin A1c and lipid panel in a.m. - ASA - Appreciate neurology consultative services, will follow-up  - Social work consult   Acute metabolic encephalopathy: Patient noted be more drowsy than usual. Unclear if secondary to CVA versus other cause at this time. - Continue monitoring neuro status.  - Check UDS  Leukocytosis: Acute. WBC elevated at 15.5 on admission. - Check urinalysis and chest x-ray  H/O CVA with residual right-sided hemiparesis: Stable  Anemia of chronic disease:  Hemoglobin 9.2 on admission - Recheck CBC in a.m.  Hyperlipidemia - Check fasting lipid panel   Renal insufficiency: Baseline kidney function unknown at this time. Patient found to have BUN  26 and  creatinine 1.4 on admission. - continue to monitor    DVT prophylaxis: Lovenox Code Status:Full  Family Communication: Discussed plan of care with the patient brother is present at bedside Disposition Plan: TBD Consults called: Neurology  Admission status: Observation  Clydie Braun MD [facility] Pager [phone]  If 7PM-7AM, please contact night-coverage [email or web address] Password [facility]  08/06/2016, 8:34 AM

## 2016-08-06 NOTE — ED Notes (Signed)
Dr Katrinka Blazing  Is admitting doctor.  I asked him to come  Back and check the pt   He did come back and checked the pt  He has changed his room to a stepdown bed

## 2016-08-06 NOTE — Progress Notes (Signed)
Patient seen and evaluated earlier this am. No new problems reported by nursing. Orders in place.  Neurology on board.  Will reassess next am. Or sooner should a new medical problem arise.  Gen: Patient in no acute distress Vital signs stable  Malik Bruce

## 2016-08-06 NOTE — Progress Notes (Addendum)
Malik Bruce is a 66 y.o male who presents with acute onset of receptive and expressive aphasia. Pt has a hx of Left MCA with Right side deficits. Pt to be admitted to [facility] for further TIA work up.   LKW 1700, CBG 107, NIH 16.

## 2016-08-06 NOTE — Consult Note (Addendum)
Referring Physician: Dr. Judd Lien    Chief Complaint: Acute onset of left sided weakness  HPI: Malik Bruce is an 66 y.o. male who presents for acute onset of receptive and expressive aphasia. Symptom onset and LKN noted to be at 5 PM today by nursing home staff. First symptom was excess drooling with some difficulty speaking. Subsequendly, at 10:40 PM, the patient deteriorated further, with complete loss of speech output.   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   LSN: 5:00 PM tPA Given: No: Out of IV tPA time window.   PMHx: DM2 HTN.  No past surgical history on file.  No family history on file. Social History:  has no tobacco, alcohol, and drug history on file.  Allergies: Allergies not on file  Home Medications:  ASA 325 qd Atorvastatin 40 mg qd B12 Lasix Levemir Lisinopril Lyrica Mapap 500 mg caplet Metformin Novolog Potassium chloride Vitamin B complex PRN: Loperamide, MOM, Mintox, Robafen and triple antibiotic ointment   ROS: Unobtainable due to severe expressive aphasia.    Physical Examination: Blood pressure 132/72, pulse 90, temperature 98.9 F (37.2 C), temperature source Oral, height 6' (1.829 m), weight 74.6 kg (164 lb 7.4 oz), SpO2 98 %.  HEENT: [place]/AT Ext: Pretibial edema and trophic changes bilateral lower extremities.   Neurologic Examination: Mental Status: Awake. Poor attention. Dense expressive aphasia with dysarthria. Able to follow some simple motor commands. Unable to assess orientation.  Cranial Nerves: II:  No blink to threat presented to right visual field. PERRL.  III,IV, VI: Visual pursuits with prominent saccadic quality, worse when gazing to right. No nystagmus.  V,VII: right facial droop. Decreased responsiveness to tactile stimuli applied to face bilaterally. VIII: hearing grossly intact to voice IX,X: Hypophonic and severely dysarthric speech XI: Decreased movement on right.  XII: Does not follow  command for tongue protrusion Motor: RUE: Flexion contractures with increased flexor tone. Arm held adducted at shoulder and internally rotated. No volitional movement. RLE: Briefly raises above bed antigravity and weakly withdraws to noxious. Increased tone.  LUE: 3/5 proximal and distal LLE: Briefly raises above bed antigravity and withdraws to noxious. Moves more briskly than on right.  Sensory: Decreased responsiveness to noxious stimuli bilateral upper extremities. Withdraws to plantar stimulation bilaterally.  Deep Tendon Reflexes:  Absent RUE reflexes in context of severely increased tone. 1+ LUE reflexes. Low amplitude briskly reactive patellar reflexes. 0 achilles bilaterally. Right toe upgoing, left toe equivocal.  Cerebellar: Slow FNF on left without definite ataxia.  Gait: Unable to assess   Results for orders placed or performed during the hospital encounter of 08/06/16 (from the past 48 hour(s))  I-Stat CG4 Lactic Acid, ED     Status: None   Collection Time: 08/06/16  3:50 AM  Result Value Ref Range   Lactic Acid, Venous 1.48 0.5 - 1.9 mmol/L   Ct Angio Head W Or Wo Contrast  Result Date: 08/06/2016 CLINICAL DATA:  Left-sided weakness and dysphasia EXAM: CT ANGIOGRAPHY HEAD AND NECK CT PERFUSION BRAIN TECHNIQUE: Multidetector CT imaging of the head and neck was performed using the standard protocol during bolus administration of intravenous contrast. Multiplanar CT image reconstructions and MIPs were obtained to evaluate the vascular anatomy. Carotid stenosis measurements (when applicable) are obtained utilizing NASCET criteria, using the distal internal carotid diameter as the denominator. Multiphase CT imaging of the brain was performed following IV bolus contrast injection. Subsequent parametric perfusion maps were calculated using RAPID software. CONTRAST:  100  mL Isovue 370 IV COMPARISON:  None. FINDINGS: CTA NECK FINDINGS Aortic arch: There is no aneurysm or dissection of the  visualized ascending aorta or aortic arch. There is a normal 3 vessel branching pattern. The visualized proximal subclavian arteries are normal. Right carotid system: The right common carotid origin is widely patent. There is no common carotid or internal carotid artery dissection or aneurysm. There is mixed calcified and noncalcified plaque at the bifurcation but no hemodynamically significant stenosis. Left carotid system: There is a large calcification along the medial aspect of the left common carotid lumen there results in approximately 60% stenosis. There is no common carotid or internal carotid artery dissection or aneurysm. There is atherosclerotic calcification of the bifurcation but no hemodynamically significant stenosis. Vertebral arteries: The vertebral system is codominant. There is moderate narrowing of both vertebral artery origins secondary to atherosclerotic calcification. There is multifocal atherosclerosis within both V1 segments. The V2 and V3 segments are normal. There is minimal calcification in the left V4 segment. Skeleton: There is no bony spinal canal stenosis. No lytic or blastic lesions. Other neck: The nasopharynx is clear. The oropharynx and hypopharynx are normal. The epiglottis is normal. The supraglottic larynx, glottis and subglottic larynx are normal. No retropharyngeal collection. The parapharyngeal spaces are preserved. The parotid and submandibular glands are normal. No sialolithiasis or salivary ductal dilatation. The thyroid gland is normal. There is no cervical lymphadenopathy. Upper chest: No pneumothorax or pleural effusion. No nodules or masses. Review of the MIP images confirms the above findings CTA HEAD FINDINGS Anterior circulation: --Intracranial internal carotid arteries: Mild atherosclerotic calcification without hemodynamically significant stenosis. --Anterior cerebral arteries: Normal. --Middle cerebral arteries: Normal. --Posterior communicating arteries:  Present bilaterally Posterior circulation: --Posterior cerebral arteries: Normal. --Superior cerebellar arteries: Normal. --Basilar artery: Normal. --Anterior inferior cerebellar arteries: Normal. --Posterior inferior cerebellar arteries: Normal. Venous sinuses: As permitted by contrast timing, patent. Anatomic variants: Fetal origin of the left vertebral artery. Other findings: There is a superior left convexity extra-axial contrast-enhancing mass that measures 13 x 10 mm. Review of the MIP images confirms the above findings CT Brain Perfusion Findings: CBF (<30%) Volume: 0mL Perfusion (Tmax>6.0s) volume: 0mL Mismatch Volume: 0mL Infarction Location:None IMPRESSION: 1. Normal perfusion scan of the brain. 2. No intracranial or cervical large vessel occlusion. 3. Right carotid bifurcation atherosclerosis without hemodynamically significant stenosis. Chunky calcification within the left common carotid artery, resulting in 60% stenosis. 4. Bilateral moderate narrowing of the vertebral artery origins due to atherosclerotic calcification. Otherwise normal vertebral arteries. 5. Extra-axial contrast-enhancing mass at the superior left convexity, likely meningioma. Electronically Signed   By: Deatra Robinson M.D.   On: 08/06/2016 00:42   Ct Angio Neck W Or Wo Contrast  Result Date: 08/06/2016 CLINICAL DATA:  Left-sided weakness and dysphasia EXAM: CT ANGIOGRAPHY HEAD AND NECK CT PERFUSION BRAIN TECHNIQUE: Multidetector CT imaging of the head and neck was performed using the standard protocol during bolus administration of intravenous contrast. Multiplanar CT image reconstructions and MIPs were obtained to evaluate the vascular anatomy. Carotid stenosis measurements (when applicable) are obtained utilizing NASCET criteria, using the distal internal carotid diameter as the denominator. Multiphase CT imaging of the brain was performed following IV bolus contrast injection. Subsequent parametric perfusion maps were  calculated using RAPID software. CONTRAST:  100 mL Isovue 370 IV COMPARISON:  None. FINDINGS: CTA NECK FINDINGS Aortic arch: There is no aneurysm or dissection of the visualized ascending aorta or aortic arch. There is a normal 3 vessel branching pattern. The visualized proximal  subclavian arteries are normal. Right carotid system: The right common carotid origin is widely patent. There is no common carotid or internal carotid artery dissection or aneurysm. There is mixed calcified and noncalcified plaque at the bifurcation but no hemodynamically significant stenosis. Left carotid system: There is a large calcification along the medial aspect of the left common carotid lumen there results in approximately 60% stenosis. There is no common carotid or internal carotid artery dissection or aneurysm. There is atherosclerotic calcification of the bifurcation but no hemodynamically significant stenosis. Vertebral arteries: The vertebral system is codominant. There is moderate narrowing of both vertebral artery origins secondary to atherosclerotic calcification. There is multifocal atherosclerosis within both V1 segments. The V2 and V3 segments are normal. There is minimal calcification in the left V4 segment. Skeleton: There is no bony spinal canal stenosis. No lytic or blastic lesions. Other neck: The nasopharynx is clear. The oropharynx and hypopharynx are normal. The epiglottis is normal. The supraglottic larynx, glottis and subglottic larynx are normal. No retropharyngeal collection. The parapharyngeal spaces are preserved. The parotid and submandibular glands are normal. No sialolithiasis or salivary ductal dilatation. The thyroid gland is normal. There is no cervical lymphadenopathy. Upper chest: No pneumothorax or pleural effusion. No nodules or masses. Review of the MIP images confirms the above findings CTA HEAD FINDINGS Anterior circulation: --Intracranial internal carotid arteries: Mild atherosclerotic  calcification without hemodynamically significant stenosis. --Anterior cerebral arteries: Normal. --Middle cerebral arteries: Normal. --Posterior communicating arteries: Present bilaterally Posterior circulation: --Posterior cerebral arteries: Normal. --Superior cerebellar arteries: Normal. --Basilar artery: Normal. --Anterior inferior cerebellar arteries: Normal. --Posterior inferior cerebellar arteries: Normal. Venous sinuses: As permitted by contrast timing, patent. Anatomic variants: Fetal origin of the left vertebral artery. Other findings: There is a superior left convexity extra-axial contrast-enhancing mass that measures 13 x 10 mm. Review of the MIP images confirms the above findings CT Brain Perfusion Findings: CBF (<30%) Volume: 0mL Perfusion (Tmax>6.0s) volume: 0mL Mismatch Volume: 0mL Infarction Location:None IMPRESSION: 1. Normal perfusion scan of the brain. 2. No intracranial or cervical large vessel occlusion. 3. Right carotid bifurcation atherosclerosis without hemodynamically significant stenosis. Chunky calcification within the left common carotid artery, resulting in 60% stenosis. 4. Bilateral moderate narrowing of the vertebral artery origins due to atherosclerotic calcification. Otherwise normal vertebral arteries. 5. Extra-axial contrast-enhancing mass at the superior left convexity, likely meningioma. Electronically Signed   By: Deatra Robinson M.D.   On: 08/06/2016 00:42   Mr Brain Wo Contrast  Result Date: 08/06/2016 CLINICAL DATA:  A aphasia and left-sided weakness. Left facial droop. EXAM: MRI HEAD WITHOUT CONTRAST MRA HEAD WITHOUT CONTRAST TECHNIQUE: Multiplanar, multiecho pulse sequences of the brain and surrounding structures were obtained without intravenous contrast. Angiographic images of the head were obtained using MRA technique without contrast. COMPARISON:  Head CT 08/06/2016 FINDINGS: MRI HEAD FINDINGS Brain: The midline structures are normal. There is a small focus of  diffusion restriction measuring approximately 8 mm within the right pons. No other areas of diffusion restriction. There is encephalomalacia at the site of old left MCA infarct. There are multiple old bilateral lacunar infarcts. There is a large amount of hyperintense T2 weighted signal within the left frontoparietal white matter. There is also a small old right cerebellar infarct. There is a punctate focus of petechial hemorrhage within the right pons. There is hemosiderin deposition in the left MCA distribution. No mass lesion. No chronic microhemorrhage or cerebral amyloid angiopathy. No hydrocephalus, age advanced atrophy or lobar predominant volume loss. No dural abnormality or extra-axial  collection. Skull and upper cervical spine: The visualized skull base, calvarium, upper cervical spine and extracranial soft tissues are normal. Sinuses/Orbits: No fluid levels or advanced mucosal thickening. No mastoid effusion. Normal orbits. MRA HEAD FINDINGS Intracranial internal carotid arteries: Normal. Anterior cerebral arteries: Normal. Middle cerebral arteries: Normal. Posterior communicating arteries: Present bilaterally. Posterior cerebral arteries: Normal.  Fetal origin on the left. Basilar artery: Normal. Vertebral arteries: Codominant. Normal. Superior cerebellar arteries: Normal. Anterior inferior cerebellar arteries: Normal. Posterior inferior cerebellar arteries: Normal. IMPRESSION: 1. Acute infarct within the right pons, in keeping with left-sided weakness. Punctate focus of nearby microhemorrhage seen on gradient echo imaging is suspected to be chronic. Regardless, no space-occupying hematoma. 2. Multiple old infarcts, including large left MCA distribution infarct, with associated encephalomalacia. 3. Normal intracranial MRA. Electronically Signed   By: Deatra Robinson M.D.   On: 08/06/2016 05:19   Ct Cerebral Perfusion W Contrast  Result Date: 08/06/2016 CLINICAL DATA:  Left-sided weakness and dysphasia  EXAM: CT ANGIOGRAPHY HEAD AND NECK CT PERFUSION BRAIN TECHNIQUE: Multidetector CT imaging of the head and neck was performed using the standard protocol during bolus administration of intravenous contrast. Multiplanar CT image reconstructions and MIPs were obtained to evaluate the vascular anatomy. Carotid stenosis measurements (when applicable) are obtained utilizing NASCET criteria, using the distal internal carotid diameter as the denominator. Multiphase CT imaging of the brain was performed following IV bolus contrast injection. Subsequent parametric perfusion maps were calculated using RAPID software. CONTRAST:  100 mL Isovue 370 IV COMPARISON:  None. FINDINGS: CTA NECK FINDINGS Aortic arch: There is no aneurysm or dissection of the visualized ascending aorta or aortic arch. There is a normal 3 vessel branching pattern. The visualized proximal subclavian arteries are normal. Right carotid system: The right common carotid origin is widely patent. There is no common carotid or internal carotid artery dissection or aneurysm. There is mixed calcified and noncalcified plaque at the bifurcation but no hemodynamically significant stenosis. Left carotid system: There is a large calcification along the medial aspect of the left common carotid lumen there results in approximately 60% stenosis. There is no common carotid or internal carotid artery dissection or aneurysm. There is atherosclerotic calcification of the bifurcation but no hemodynamically significant stenosis. Vertebral arteries: The vertebral system is codominant. There is moderate narrowing of both vertebral artery origins secondary to atherosclerotic calcification. There is multifocal atherosclerosis within both V1 segments. The V2 and V3 segments are normal. There is minimal calcification in the left V4 segment. Skeleton: There is no bony spinal canal stenosis. No lytic or blastic lesions. Other neck: The nasopharynx is clear. The oropharynx and  hypopharynx are normal. The epiglottis is normal. The supraglottic larynx, glottis and subglottic larynx are normal. No retropharyngeal collection. The parapharyngeal spaces are preserved. The parotid and submandibular glands are normal. No sialolithiasis or salivary ductal dilatation. The thyroid gland is normal. There is no cervical lymphadenopathy. Upper chest: No pneumothorax or pleural effusion. No nodules or masses. Review of the MIP images confirms the above findings CTA HEAD FINDINGS Anterior circulation: --Intracranial internal carotid arteries: Mild atherosclerotic calcification without hemodynamically significant stenosis. --Anterior cerebral arteries: Normal. --Middle cerebral arteries: Normal. --Posterior communicating arteries: Present bilaterally Posterior circulation: --Posterior cerebral arteries: Normal. --Superior cerebellar arteries: Normal. --Basilar artery: Normal. --Anterior inferior cerebellar arteries: Normal. --Posterior inferior cerebellar arteries: Normal. Venous sinuses: As permitted by contrast timing, patent. Anatomic variants: Fetal origin of the left vertebral artery. Other findings: There is a superior left convexity extra-axial contrast-enhancing mass that measures 13 x  10 mm. Review of the MIP images confirms the above findings CT Brain Perfusion Findings: CBF (<30%) Volume: 0mL Perfusion (Tmax>6.0s) volume: 0mL Mismatch Volume: 0mL Infarction Location:None IMPRESSION: 1. Normal perfusion scan of the brain. 2. No intracranial or cervical large vessel occlusion. 3. Right carotid bifurcation atherosclerosis without hemodynamically significant stenosis. Chunky calcification within the left common carotid artery, resulting in 60% stenosis. 4. Bilateral moderate narrowing of the vertebral artery origins due to atherosclerotic calcification. Otherwise normal vertebral arteries. 5. Extra-axial contrast-enhancing mass at the superior left convexity, likely meningioma. Electronically  Signed   By: Deatra Robinson M.D.   On: 08/06/2016 00:42   Dg Chest Port 1 View  Result Date: 08/06/2016 CLINICAL DATA:  Dyspnea this evening EXAM: PORTABLE CHEST 1 VIEW COMPARISON:  None. FINDINGS: Borderline cardiomegaly. No aortic aneurysm. Pulmonary vascular congestion consistent with mild CHF. Probable small pleural effusions. No acute nor suspicious osseous abnormality. Degenerative changes are seen along the dorsal spine and both shoulders. IMPRESSION: Borderline cardiomegaly with mild vascular congestion consistent with CHF. Probable bilateral small pleural effusions. Electronically Signed   By: Tollie Eth M.D.   On: 08/06/2016 02:41   Mr Maxine Glenn Head/brain ZO Cm  Result Date: 08/06/2016 CLINICAL DATA:  A aphasia and left-sided weakness. Left facial droop. EXAM: MRI HEAD WITHOUT CONTRAST MRA HEAD WITHOUT CONTRAST TECHNIQUE: Multiplanar, multiecho pulse sequences of the brain and surrounding structures were obtained without intravenous contrast. Angiographic images of the head were obtained using MRA technique without contrast. COMPARISON:  Head CT 08/06/2016 FINDINGS: MRI HEAD FINDINGS Brain: The midline structures are normal. There is a small focus of diffusion restriction measuring approximately 8 mm within the right pons. No other areas of diffusion restriction. There is encephalomalacia at the site of old left MCA infarct. There are multiple old bilateral lacunar infarcts. There is a large amount of hyperintense T2 weighted signal within the left frontoparietal white matter. There is also a small old right cerebellar infarct. There is a punctate focus of petechial hemorrhage within the right pons. There is hemosiderin deposition in the left MCA distribution. No mass lesion. No chronic microhemorrhage or cerebral amyloid angiopathy. No hydrocephalus, age advanced atrophy or lobar predominant volume loss. No dural abnormality or extra-axial collection. Skull and upper cervical spine: The visualized  skull base, calvarium, upper cervical spine and extracranial soft tissues are normal. Sinuses/Orbits: No fluid levels or advanced mucosal thickening. No mastoid effusion. Normal orbits. MRA HEAD FINDINGS Intracranial internal carotid arteries: Normal. Anterior cerebral arteries: Normal. Middle cerebral arteries: Normal. Posterior communicating arteries: Present bilaterally. Posterior cerebral arteries: Normal.  Fetal origin on the left. Basilar artery: Normal. Vertebral arteries: Codominant. Normal. Superior cerebellar arteries: Normal. Anterior inferior cerebellar arteries: Normal. Posterior inferior cerebellar arteries: Normal. IMPRESSION: 1. Acute infarct within the right pons, in keeping with left-sided weakness. Punctate focus of nearby microhemorrhage seen on gradient echo imaging is suspected to be chronic. Regardless, no space-occupying hematoma. 2. Multiple old infarcts, including large left MCA distribution infarct, with associated encephalomalacia. 3. Normal intracranial MRA. Electronically Signed   By: Deatra Robinson M.D.   On: 08/06/2016 05:19   Ct Head Code Stroke W/o Cm  Result Date: 08/06/2016 CLINICAL DATA:  Code stroke. Left-sided weakness, drooling and slurred speech EXAM: CT HEAD WITHOUT CONTRAST TECHNIQUE: Contiguous axial images were obtained from the base of the skull through the vertex without intravenous contrast. COMPARISON:  None. FINDINGS: Brain: There is encephalomalacia and the left MCA distribution, likely chronic infarct. There are multiple age-indeterminate lacunar infarcts of the  right basal ganglia and adjacent white matter. No midline shift or mass effect. No acute hemorrhage. Vascular: No hyperdense vessel. Atherosclerotic calcification of the vertebral and internal carotid arteries at the skull base. Skull: Normal. Negative for fracture or focal lesion. Sinuses/Orbits: The visualized portions of the paranasal sinuses and mastoid air cells are free of fluid. No advanced  mucosal thickening. The visualized orbits are normal. Other: None ASPECTS (Alberta Stroke Program Early CT Score) - Ganglionic level infarction (caudate, lentiform nuclei, internal capsule, insula, M1-M3 cortex): 6 - Supraganglionic infarction (M4-M6 cortex): 2 Total score (0-10 with 10 being normal): 8 IMPRESSION: 1. Age indeterminate lacunar infarcts of the right basal ganglia and thalamus. MRI is recommended for more definitive temporal characterization. 2. No acute hemorrhage. 3. Multiple old infarcts including the left MCA distribution and left basal ganglia/thalamus. 4. ASPECTS is 8. These results were called by telephone at the time of interpretation on 08/06/2016 at 12:11 am to Dr. Caryl Pina who verbally acknowledged these results. Electronically Signed   By: Deatra Robinson M.D.   On: 08/06/2016 00:11    Assessment: 66 y.o. male with acute onset of left sided weakness, aphasia and facial droop. 1. Above presentation is on a baseline of right hemiplegia from prior left MCA CVA. Total NIHSS is 16.  2. Had gradually worsening speech since 5 PM Wednesday. Acutely worsened at 10:40 PM. CT perfusion was negative. CTA showed atherosclerotic changes/stenoses but no LVO. DDx based upon the clinical presentation and negative CT imaging was lacunar stroke versus metabolic/infectious/toxic encephalopathy.  3. Follow up MRI revealed an acute right pontine ischemic infarction. Punctate focus of nearby microhemorrhage seen on gradient echo imaging is suspected to be chronic.  4. Imaging studies also show age indeterminate lacunar infarcts of the right basal ganglia and thalamus as well as multiple old infarcts including the left MCA distribution and left basal ganglia/thalamus.  5. Extra-axial contrast-enhancing mass at the superior left convexity, likely meningioma.  Plan: 1. HgbA1c, fasting lipid panel 2. Echocardiogram 3. PT consult, OT consult, Speech consult 4. Continue ASA for now. Consider switching  to Plavix as he has failed ASA monotherapy. History still needs to be determined from SNF records. If he has a history of stenting, then add Plavix to ASA for DAPT.  5. Continue atorvastatin.  6. Permissive HTN x 24 hours.  7. Telemetry monitoring 8. Frequent neuro checks   signed: Dr. Caryl Pina  08/06/2016, 7:29 AM

## 2016-08-06 NOTE — Progress Notes (Signed)
Pt unable to follow commands unable to perform NIHSS.

## 2016-08-06 NOTE — ED Notes (Addendum)
2nd call made  The neurologist called back he suggested that a  c-t of the head be repeated.  Dr Katrinka Blazing aware of the discussion but he wants a mri instead.  Mri will be ready  In approx 30 minutes

## 2016-08-06 NOTE — Evaluation (Signed)
Speech Language Pathology Evaluation Patient Details Name: Malik Bruce MRN: [id number] DOB: July 30, 1950 Today's Date: 08/06/2016 Time: 4098-1191 SLP Time Calculation (min) (ACUTE ONLY): 19 min  Problem List:  Patient Active Problem List   Diagnosis Date Noted  . CVA (cerebral vascular accident) (HCC) 08/06/2016   Past Medical History: No past medical history on file. Past Surgical History: No past surgical history on file. HPI:  Pt is a 66 y.o.maleadmitted with acute onset of left sided weakness, aphasia, and facial droop. 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 No prior SLP notes are available, but pt denies any h/o dysphagia.   Assessment / Plan / Recommendation Clinical Impression  Pt's communication is significantly impaired, with severe dysarthria impacting intelligiblity of speech at the word and short phrase level. He has limited awareness of his motor speech abilities, needing Mod cues to attempt to use of intelligibility strategies. While his simple comprehension of basic questions and commands seems intact, he has difficulty with comprehension of mildly complex information. This may be more related to his prior L MCA CVA, although family is not present to confirm PLOF. Pt will need SLP f/u to maximize functional communication with further differential diagnosis of cognitive abilities as able.    SLP Assessment  SLP Recommendation/Assessment: Patient needs continued Speech Lanaguage Pathology Services SLP Visit Diagnosis: Dysarthria and anarthria (R47.1)    Follow Up Recommendations   (tba)    Frequency and Duration min 2x/week  2 weeks      SLP Evaluation Cognition  Overall Cognitive Status: Difficult to assess (communication difficulties) Arousal/Alertness: Awake/alert Orientation Level: Oriented to person;Oriented to place;Disoriented to time;Disoriented to situation Attention:  Sustained Sustained Attention: Appears intact Awareness: Impaired Awareness Impairment: Emergent impairment Safety/Judgment: Impaired       Comprehension  Auditory Comprehension Overall Auditory Comprehension: Impaired Yes/No Questions: Impaired Complex Questions: 25-49% accurate Commands: Impaired One Step Basic Commands: 75-100% accurate Conversation: Simple    Expression Expression Primary Mode of Expression: Verbal Verbal Expression Overall Verbal Expression: Other (comment) (difficult to assess given level of dysarthria)   Oral / Motor  Oral Motor/Sensory Function Overall Oral Motor/Sensory Function: Other (comment) (pt with difficulty completing tasks to formally assess) Motor Speech Overall Motor Speech: Impaired Respiration: Impaired Level of Impairment: Sentence Phonation: Normal Articulation: Impaired Level of Impairment: Word Intelligibility: Intelligibility reduced Word: 25-49% accurate Phrase: 25-49% accurate   GO                    Maxcine Ham 08/06/2016, 3:05 PM  Maxcine Ham, M.A. CCC-SLP [phone]

## 2016-08-06 NOTE — ED Notes (Signed)
Report called to [facility] on 4e  Will take up as soon as he returns to this dept

## 2016-08-06 NOTE — ED Notes (Signed)
The pt cannot follow instructiions

## 2016-08-06 NOTE — ED Notes (Signed)
The pts son is here

## 2016-08-06 NOTE — ED Triage Notes (Signed)
The pt arrived by [facility] FROM A NURSING HOME IN [facility].   [facility] DIFFERENT 1700  LESS SPEECH SINCE 2200.  OLD STROKE ON LT RT SIDED WEAKNESS.  PT ALERT ATTEMPTS TO FOLLOW INSTRUCTIONS  SPEECH IS GARBLED SKIN WARM AND DRY

## 2016-08-06 NOTE — ED Notes (Signed)
The pts brother is at the bedside

## 2016-08-06 NOTE — ED Notes (Signed)
THE SON IS ON THE WAY HERE HE WENT  TO [facility] ED

## 2016-08-06 NOTE — ED Notes (Signed)
The pts brother reports that his speech has been getting less understandable  For the past few weeks

## 2016-08-06 NOTE — ED Notes (Signed)
Admitting doctor at the bedside 

## 2016-08-06 NOTE — ED Notes (Signed)
The pt is so sleepy that he is not staying awake long enough to perform  tasks

## 2016-08-06 NOTE — Care Management Note (Addendum)
Case Management Note  Patient Details  Name: Malik Bruce MRN: [id number] Date of Birth: January 06, 1951  Subjective/Objective:    From a SNF,with R CVA work up, acute encephalopahty, leukocytosis, has hx of CVA,anemia, renal insuff, he is from [place] but has been in [place] for 8 months per [employer] fund[employer].  No insurance listed, await pt/ot eval.                 Action/Plan:  NCM will follow for dc needs.  Expected Discharge Date:                  Expected Discharge Plan:  Skilled Nursing Facility  In-House Referral:  Clinical Social Work  Discharge planning Services  CM Consult  Post Acute Care Choice:    Choice offered to:     DME Arranged:    DME Agency:     HH Arranged:    HH Agency:     Status of Service:  In process, will continue to follow  If discussed at Long Length of Stay Meetings, dates discussed:    Additional Comments:  Leone Haven, RN 08/06/2016, 4:04 PM

## 2016-08-06 NOTE — ED Notes (Signed)
The pt went to mri one hour ago  Unable to do neuro check

## 2016-08-06 NOTE — Evaluation (Signed)
Clinical/Bedside Swallow Evaluation Patient Details  Name: Malik Bruce MRN: [id number] Date of Birth: 08-30-1950  Today's Date: 08/06/2016 Time: SLP Start Time (ACUTE ONLY): 1403 SLP Stop Time (ACUTE ONLY): 1415 SLP Time Calculation (min) (ACUTE ONLY): 12 min  Past Medical History: No past medical history on file. Past Surgical History: No past surgical history on file. HPI:  Pt is a 66 y.o.maleadmitted with acute onset of left sided weakness, aphasia, and facial droop. 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 No prior SLP notes are available, but pt denies any h/o dysphagia.   Assessment / Plan / Recommendation Clinical Impression  Pt has a mild amount of secretions pooling in his oral cavity at baseline and he reports anterior spillage throughout the day, although this was not directly observed by SLP. Ice chips and small amounts of water intermittently elicit coughing, with pt saying that he felt like he was "choking". Given the above and pt's acute pontine infarct, recommend proceeding with MBS prior to initiation of any POs.  SLP Visit Diagnosis: Dysphagia, oropharyngeal phase (R13.12)    Aspiration Risk  Moderate aspiration risk    Diet Recommendation NPO   Medication Administration: Via alternative means    Other  Recommendations Oral Care Recommendations: Oral care QID Other Recommendations: Have oral suction available   Follow up Recommendations  (tba)      Frequency and Duration            Prognosis Prognosis for Safe Diet Advancement: Good      Swallow Study   General HPI: Pt is a 66 y.o.maleadmitted with acute onset of left sided weakness, aphasia, and facial droop. 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 No prior SLP notes are available, but pt denies any h/o dysphagia. Type of Study: Bedside Swallow  Evaluation Previous Swallow Assessment: none in chart Diet Prior to this Study: NPO Temperature Spikes Noted: No Respiratory Status: Room air History of Recent Intubation: No Behavior/Cognition: Alert;Cooperative Oral Cavity Assessment: Excessive secretions Oral Cavity - Dentition: Adequate natural dentition Patient Positioning: Upright in bed Baseline Vocal Quality: Normal Volitional Cough: Weak Volitional Swallow: Unable to elicit    Oral/Motor/Sensory Function Overall Oral Motor/Sensory Function: Other (comment) (pt with difficulty completing tasks to assess formally)   Ice Chips Ice chips: Impaired Presentation: Spoon Pharyngeal Phase Impairments: Cough - Immediate   Thin Liquid Thin Liquid: Impaired Presentation: Spoon Pharyngeal  Phase Impairments: Suspected delayed Swallow    Nectar Thick Nectar Thick Liquid: Not tested   Honey Thick Honey Thick Liquid: Not tested   Puree Puree: Not tested   Solid   GO   Solid: Not tested        Maxcine Ham 08/06/2016,2:54 PM  Maxcine Ham, M.A. CCC-SLP [phone]

## 2016-08-06 NOTE — ED Notes (Signed)
Call placed to the rapid response  And she recommended that I call the neurologist.  The pt is becoming more obtunded

## 2016-08-07 ENCOUNTER — Inpatient Hospital Stay (HOSPITAL_COMMUNITY): Payer: Medicare Other

## 2016-08-07 ENCOUNTER — Other Ambulatory Visit (HOSPITAL_COMMUNITY): Payer: Self-pay

## 2016-08-07 DIAGNOSIS — E784 Other hyperlipidemia: Secondary | ICD-10-CM

## 2016-08-07 LAB — CBC WITH DIFFERENTIAL/PLATELET
BASOS PCT: 0 %
Basophils Absolute: 0 10*3/uL (ref 0.0–0.1)
Eosinophils Absolute: 0.2 10*3/uL (ref 0.0–0.7)
Eosinophils Relative: 2 %
HEMATOCRIT: 30 % — AB (ref 39.0–52.0)
HEMOGLOBIN: 9.2 g/dL — AB (ref 13.0–17.0)
LYMPHS ABS: 1.5 10*3/uL (ref 0.7–4.0)
Lymphocytes Relative: 15 %
MCH: 25.7 pg — AB (ref 26.0–34.0)
MCHC: 30.7 g/dL (ref 30.0–36.0)
MCV: 83.8 fL (ref 78.0–100.0)
MONOS PCT: 7 %
Monocytes Absolute: 0.7 10*3/uL (ref 0.1–1.0)
NEUTROS PCT: 76 %
Neutro Abs: 7.9 10*3/uL — ABNORMAL HIGH (ref 1.7–7.7)
Platelets: 312 10*3/uL (ref 150–400)
RBC: 3.58 MIL/uL — AB (ref 4.22–5.81)
RDW: 14.8 % (ref 11.5–15.5)
WBC: 10.3 10*3/uL (ref 4.0–10.5)

## 2016-08-07 LAB — LIPID PANEL
CHOLESTEROL: 93 mg/dL (ref 0–200)
HDL: 19 mg/dL — AB (ref >40)
LDL Cholesterol: 40 mg/dL (ref 0–99)
Total CHOL/HDL Ratio: 4.9 RATIO
Triglycerides: 170 mg/dL — ABNORMAL HIGH (ref <150)
VLDL: 34 mg/dL (ref 0–40)

## 2016-08-07 LAB — BASIC METABOLIC PANEL
Anion gap: 9 (ref 5–15)
BUN: 21 mg/dL — ABNORMAL HIGH (ref 6–20)
CHLORIDE: 105 mmol/L (ref 101–111)
CO2: 23 mmol/L (ref 22–32)
CREATININE: 1.19 mg/dL (ref 0.61–1.24)
Calcium: 9.1 mg/dL (ref 8.9–10.3)
GFR calc non Af Amer: >60 mL/min (ref >60)
Glucose, Bld: 97 mg/dL (ref 65–99)
POTASSIUM: 4.4 mmol/L (ref 3.5–5.1)
Sodium: 137 mmol/L (ref 135–145)

## 2016-08-07 LAB — GLUCOSE, CAPILLARY: Glucose-Capillary: 95 mg/dL (ref 65–99)

## 2016-08-07 MED ORDER — GLUCERNA SHAKE PO LIQD
237.0000 mL | Freq: Three times a day (TID) | ORAL | Status: DC
  Administered 2016-08-07 – 2016-08-11 (×5): 237 mL via ORAL
  Filled 2016-08-07 (×15): qty 237

## 2016-08-07 NOTE — Progress Notes (Signed)
Modified Barium Swallow Progress Note  Patient Details  Name: Malik Bruce MRN: [id number] Date of Birth: [birth year]  Today's Date: 08/07/2016  Modified Barium Swallow completed.  Full report located under Chart Review in the Imaging Section.  Brief recommendations include the following:  Clinical Impression  Pt demonstrates a moderate to severe oral and oropharyngeal dysphagia. Lingual weakness results in impared manipulation and formation of bolus with premature spillage, pumping mechanism and oral residuals. Oropharyngeal phase characterzied by delayed swallow initiation leading to aspiration before the swallow with thin liquid and large nectar thick boluses; sensation only with large quantity of aspiration. There is also significant base of tongue weakenss with mdoerate vallcuelar residuals. Also impacting pts function is difficulty following instructions due to aphasia and impulsivity and decreased awareness with self feeding. SLP had to provide max assist to decrease bolus size, resorting finally to spoon feeding. Pt also need max visual and tactile assist to attempt positional strategies (chin tuck, head turn/tilt - not effective in reducing residue). Pt also could not achieve a consistent second swallow or effortful swallo with commands and volitional cough was very weak. Pt will be at risk of aspiration with all PO consistencies, but best conservative consistency to test for toelrance is dys 1 (puree) with nectar thick liquids via teaspoon. Pt will need base of tongue exercises and traning for a second swallow, rate control and possible RMT. Will follow for tolerance and therapy.    Swallow Evaluation Recommendations       SLP Diet Recommendations: Dysphagia 1 (Puree) solids;Nectar thick liquid   Liquid Administration via: Spoon   Medication Administration: Crushed with puree   Supervision: Full assist for feeding;Full supervision/cueing for compensatory strategies   Compensations: Slow rate;Small sips/bites;Monitor for anterior loss;Multiple dry swallows after each bite/sip   Postural Changes: Seated upright at 90 degrees;Remain semi-upright after after feeds/meals (Comment)   Oral Care Recommendations: Oral care BID   Other Recommendations: Have oral suction available   Harlon Ditty, MA CCC-SLP [phone]  Malik Bruce 08/07/2016,12:00 PM

## 2016-08-07 NOTE — Progress Notes (Signed)
New Admission Note:  Arrival Method: bed Mental Orientation: alert to self Telemetry: box 18 Assessment: Completed Skin: dry, scaly skin  IV:L AC/ saline locked  Pain:0/10 Tubes:n/a Safety Measures: Safety Fall Prevention Plan was given, discussed Admission: Completed 5M18: Patient has been orientated to the room, unit and the staff. Family: visiting   Orders have been reviewed and implemented. Will continue to monitor the patient. Call light has been placed within reach and bed alarm has been activated.   Lawernce Ion ,RN

## 2016-08-07 NOTE — Progress Notes (Signed)
OT Cancellation Note  Patient Details Name: Malik Bruce MRN: [id number] DOB: [birth year]   Cancelled Treatment:    Reason Eval/Treat Not Completed: Patient not medically ready (Pt with active bed rest order. Will follow.)  Evern Bio 08/07/2016, 8:30 AM  [phone]

## 2016-08-07 NOTE — Progress Notes (Signed)
PROGRESS NOTE    Malik Bruce  ZOX:[id number] DOB: 1950/11/08 DOA: 08/06/2016 PCP: No primary care provider on file.   Brief Narrative:  66 y.o. male with medical history significant of CVA with residual right-sided hemiparesis, DM type II, and HTN; who presented as a code stroke from [facility] nursing facility. He presented with new stroke   Assessment & Plan:   Principal Problem:   CVA (cerebral vascular accident) [facility]) - Patient is under going workup. Neurology on board - Speech therapy eval  Active Problems:   Acute metabolic encephalopathy - Secondary to principal problem    Leukocytosis - Resolved most likely stress reaction to principal problem    Anemia due to chronic kidney disease - Stable at 9    Hyperlipidemia - Unable to take oral medications secondary to principal problem    CVA, old, hemiparesis (HCC) - Complicating current clinical nares   DVT prophylaxis: SCDs Code Status: Full Family Communication: Discussed with family member Disposition Plan: Pending further workup from specialist   Consultants:   Neurology   Procedures: Stroke work up   Antimicrobials: None   Subjective: Pt has inability to express himself well  Objective: Vitals:   08/07/16 0345 08/07/16 0700 08/07/16 0839 08/07/16 1237  BP: (!) 150/69  (!) 152/75 (!) 157/79  Pulse: 96  (!) 101 (!) 108  Resp: (!) 35  18 20  Temp: 98.7 F (37.1 C)  98.3 F (36.8 C) 98.4 F (36.9 C)  TempSrc: Axillary Oral Oral Axillary  SpO2: 96%  100% 100%  Weight:      Height:        Intake/Output Summary (Last 24 hours) at 08/07/16 1539 Last data filed at 08/07/16 0834  Gross per 24 hour  Intake                0 ml  Output             1000 ml  Net            -1000 ml   Filed Weights   08/06/16 0530  Weight: 74.6 kg (164 lb 7.4 oz)    Examination:  General exam: Appears calm and comfortable, in nad. Respiratory system: Clear to auscultation. Respiratory effort  normal. Cardiovascular system: S1 & S2 heard, RRR. No JVD, murmurs Gastrointestinal system: Abdomen is nondistended, soft and nontender. No organomegaly or masses felt. Normal bowel sounds heard. Central nervous system: dysarthria, facial droop. Awake and alert Extremities: Right sided weakness, warm extremities Skin: No rashes, lesions or ulcers, on limited exam. Psychiatry: Unable to assess secondary to dysarthria  Data Reviewed: I have personally reviewed following labs and imaging studies  CBC:  Recent Labs Lab 08/05/16 2346 08/05/16 2356 08/07/16 0305  WBC 15.5*  --  10.3  NEUTROABS 11.9*  --  7.9*  HGB 9.2* 9.5* 9.2*  HCT 29.3* 28.0* 30.0*  MCV 84.7  --  83.8  PLT 300  --  312   Basic Metabolic Panel:  Recent Labs Lab 08/05/16 2346 08/05/16 2356 08/07/16 0305  NA 137 140 137  K 4.4 4.4 4.4  CL 106 106 105  CO2 23  --  23  GLUCOSE 115* 114* 97  BUN 26* 28* 21*  CREATININE 1.40* 1.40* 1.19  CALCIUM 9.2  --  9.1   GFR: Estimated Creatinine Clearance: 63.6 mL/min (by C-G formula based on SCr of 1.19 mg/dL). Liver Function Tests:  Recent Labs Lab 08/05/16 2346  AST 19  ALT 13*  ALKPHOS  57  BILITOT 0.5  PROT 7.5  ALBUMIN 3.4*   No results for input(s): LIPASE, AMYLASE in the last 168 hours. No results for input(s): AMMONIA in the last 168 hours. Coagulation Profile:  Recent Labs Lab 08/05/16 2346  INR 1.14   Cardiac Enzymes: No results for input(s): CKTOTAL, CKMB, CKMBINDEX, TROPONINI in the last 168 hours. BNP (last 3 results) No results for input(s): PROBNP in the last 8760 hours. HbA1C: No results for input(s): HGBA1C in the last 72 hours. CBG:  Recent Labs Lab 08/05/16 2349 08/07/16 0836  GLUCAP 107* 95   Lipid Profile:  Recent Labs  08/07/16 0305  CHOL 93  HDL 19*  LDLCALC 40  TRIG 914*  CHOLHDL 4.9   Thyroid Function Tests: No results for input(s): TSH, T4TOTAL, FREET4, T3FREE, THYROIDAB in the last 72 hours. Anemia  Panel: No results for input(s): VITAMINB12, FOLATE, FERRITIN, TIBC, IRON, RETICCTPCT in the last 72 hours. Sepsis Labs:  Recent Labs Lab 08/06/16 0350  LATICACIDVEN 1.48    Recent Results (from the past 240 hour(s))  MRSA PCR Screening     Status: None   Collection Time: 08/06/16  9:48 AM  Result Value Ref Range Status   MRSA by PCR NEGATIVE NEGATIVE Final    Comment:        The GeneXpert MRSA Assay (FDA approved for NASAL specimens only), is one component of a comprehensive MRSA colonization surveillance program. It is not intended to diagnose MRSA infection nor to guide or monitor treatment for MRSA infections.          Radiology Studies: Ct Angio Head W Or Wo Contrast  Result Date: 08/06/2016 CLINICAL DATA:  Left-sided weakness and dysphasia EXAM: CT ANGIOGRAPHY HEAD AND NECK CT PERFUSION BRAIN TECHNIQUE: Multidetector CT imaging of the head and neck was performed using the standard protocol during bolus administration of intravenous contrast. Multiplanar CT image reconstructions and MIPs were obtained to evaluate the vascular anatomy. Carotid stenosis measurements (when applicable) are obtained utilizing NASCET criteria, using the distal internal carotid diameter as the denominator. Multiphase CT imaging of the brain was performed following IV bolus contrast injection. Subsequent parametric perfusion maps were calculated using RAPID software. CONTRAST:  100 mL Isovue 370 IV COMPARISON:  None. FINDINGS: CTA NECK FINDINGS Aortic arch: There is no aneurysm or dissection of the visualized ascending aorta or aortic arch. There is a normal 3 vessel branching pattern. The visualized proximal subclavian arteries are normal. Right carotid system: The right common carotid origin is widely patent. There is no common carotid or internal carotid artery dissection or aneurysm. There is mixed calcified and noncalcified plaque at the bifurcation but no hemodynamically significant stenosis.  Left carotid system: There is a large calcification along the medial aspect of the left common carotid lumen there results in approximately 60% stenosis. There is no common carotid or internal carotid artery dissection or aneurysm. There is atherosclerotic calcification of the bifurcation but no hemodynamically significant stenosis. Vertebral arteries: The vertebral system is codominant. There is moderate narrowing of both vertebral artery origins secondary to atherosclerotic calcification. There is multifocal atherosclerosis within both V1 segments. The V2 and V3 segments are normal. There is minimal calcification in the left V4 segment. Skeleton: There is no bony spinal canal stenosis. No lytic or blastic lesions. Other neck: The nasopharynx is clear. The oropharynx and hypopharynx are normal. The epiglottis is normal. The supraglottic larynx, glottis and subglottic larynx are normal. No retropharyngeal collection. The parapharyngeal spaces are preserved. The parotid and  submandibular glands are normal. No sialolithiasis or salivary ductal dilatation. The thyroid gland is normal. There is no cervical lymphadenopathy. Upper chest: No pneumothorax or pleural effusion. No nodules or masses. Review of the MIP images confirms the above findings CTA HEAD FINDINGS Anterior circulation: --Intracranial internal carotid arteries: Mild atherosclerotic calcification without hemodynamically significant stenosis. --Anterior cerebral arteries: Normal. --Middle cerebral arteries: Normal. --Posterior communicating arteries: Present bilaterally Posterior circulation: --Posterior cerebral arteries: Normal. --Superior cerebellar arteries: Normal. --Basilar artery: Normal. --Anterior inferior cerebellar arteries: Normal. --Posterior inferior cerebellar arteries: Normal. Venous sinuses: As permitted by contrast timing, patent. Anatomic variants: Fetal origin of the left vertebral artery. Other findings: There is a superior left  convexity extra-axial contrast-enhancing mass that measures 13 x 10 mm. Review of the MIP images confirms the above findings CT Brain Perfusion Findings: CBF (<30%) Volume: 0mL Perfusion (Tmax>6.0s) volume: 0mL Mismatch Volume: 0mL Infarction Location:None IMPRESSION: 1. Normal perfusion scan of the brain. 2. No intracranial or cervical large vessel occlusion. 3. Right carotid bifurcation atherosclerosis without hemodynamically significant stenosis. Chunky calcification within the left common carotid artery, resulting in 60% stenosis. 4. Bilateral moderate narrowing of the vertebral artery origins due to atherosclerotic calcification. Otherwise normal vertebral arteries. 5. Extra-axial contrast-enhancing mass at the superior left convexity, likely meningioma. Electronically Signed   By: Deatra Robinson M.D.   On: 08/06/2016 00:42   Ct Angio Neck W Or Wo Contrast  Result Date: 08/06/2016 CLINICAL DATA:  Left-sided weakness and dysphasia EXAM: CT ANGIOGRAPHY HEAD AND NECK CT PERFUSION BRAIN TECHNIQUE: Multidetector CT imaging of the head and neck was performed using the standard protocol during bolus administration of intravenous contrast. Multiplanar CT image reconstructions and MIPs were obtained to evaluate the vascular anatomy. Carotid stenosis measurements (when applicable) are obtained utilizing NASCET criteria, using the distal internal carotid diameter as the denominator. Multiphase CT imaging of the brain was performed following IV bolus contrast injection. Subsequent parametric perfusion maps were calculated using RAPID software. CONTRAST:  100 mL Isovue 370 IV COMPARISON:  None. FINDINGS: CTA NECK FINDINGS Aortic arch: There is no aneurysm or dissection of the visualized ascending aorta or aortic arch. There is a normal 3 vessel branching pattern. The visualized proximal subclavian arteries are normal. Right carotid system: The right common carotid origin is widely patent. There is no common carotid or  internal carotid artery dissection or aneurysm. There is mixed calcified and noncalcified plaque at the bifurcation but no hemodynamically significant stenosis. Left carotid system: There is a large calcification along the medial aspect of the left common carotid lumen there results in approximately 60% stenosis. There is no common carotid or internal carotid artery dissection or aneurysm. There is atherosclerotic calcification of the bifurcation but no hemodynamically significant stenosis. Vertebral arteries: The vertebral system is codominant. There is moderate narrowing of both vertebral artery origins secondary to atherosclerotic calcification. There is multifocal atherosclerosis within both V1 segments. The V2 and V3 segments are normal. There is minimal calcification in the left V4 segment. Skeleton: There is no bony spinal canal stenosis. No lytic or blastic lesions. Other neck: The nasopharynx is clear. The oropharynx and hypopharynx are normal. The epiglottis is normal. The supraglottic larynx, glottis and subglottic larynx are normal. No retropharyngeal collection. The parapharyngeal spaces are preserved. The parotid and submandibular glands are normal. No sialolithiasis or salivary ductal dilatation. The thyroid gland is normal. There is no cervical lymphadenopathy. Upper chest: No pneumothorax or pleural effusion. No nodules or masses. Review of the MIP images confirms the above  findings CTA HEAD FINDINGS Anterior circulation: --Intracranial internal carotid arteries: Mild atherosclerotic calcification without hemodynamically significant stenosis. --Anterior cerebral arteries: Normal. --Middle cerebral arteries: Normal. --Posterior communicating arteries: Present bilaterally Posterior circulation: --Posterior cerebral arteries: Normal. --Superior cerebellar arteries: Normal. --Basilar artery: Normal. --Anterior inferior cerebellar arteries: Normal. --Posterior inferior cerebellar arteries: Normal. Venous  sinuses: As permitted by contrast timing, patent. Anatomic variants: Fetal origin of the left vertebral artery. Other findings: There is a superior left convexity extra-axial contrast-enhancing mass that measures 13 x 10 mm. Review of the MIP images confirms the above findings CT Brain Perfusion Findings: CBF (<30%) Volume: 0mL Perfusion (Tmax>6.0s) volume: 0mL Mismatch Volume: 0mL Infarction Location:None IMPRESSION: 1. Normal perfusion scan of the brain. 2. No intracranial or cervical large vessel occlusion. 3. Right carotid bifurcation atherosclerosis without hemodynamically significant stenosis. Chunky calcification within the left common carotid artery, resulting in 60% stenosis. 4. Bilateral moderate narrowing of the vertebral artery origins due to atherosclerotic calcification. Otherwise normal vertebral arteries. 5. Extra-axial contrast-enhancing mass at the superior left convexity, likely meningioma. Electronically Signed   By: Deatra Robinson M.D.   On: 08/06/2016 00:42   Mr Brain Wo Contrast  Result Date: 08/06/2016 CLINICAL DATA:  A aphasia and left-sided weakness. Left facial droop. EXAM: MRI HEAD WITHOUT CONTRAST MRA HEAD WITHOUT CONTRAST TECHNIQUE: Multiplanar, multiecho pulse sequences of the brain and surrounding structures were obtained without intravenous contrast. Angiographic images of the head were obtained using MRA technique without contrast. COMPARISON:  Head CT 08/06/2016 FINDINGS: MRI HEAD FINDINGS Brain: The midline structures are normal. There is a small focus of diffusion restriction measuring approximately 8 mm within the right pons. No other areas of diffusion restriction. There is encephalomalacia at the site of old left MCA infarct. There are multiple old bilateral lacunar infarcts. There is a large amount of hyperintense T2 weighted signal within the left frontoparietal white matter. There is also a small old right cerebellar infarct. There is a punctate focus of petechial  hemorrhage within the right pons. There is hemosiderin deposition in the left MCA distribution. No mass lesion. No chronic microhemorrhage or cerebral amyloid angiopathy. No hydrocephalus, age advanced atrophy or lobar predominant volume loss. No dural abnormality or extra-axial collection. Skull and upper cervical spine: The visualized skull base, calvarium, upper cervical spine and extracranial soft tissues are normal. Sinuses/Orbits: No fluid levels or advanced mucosal thickening. No mastoid effusion. Normal orbits. MRA HEAD FINDINGS Intracranial internal carotid arteries: Normal. Anterior cerebral arteries: Normal. Middle cerebral arteries: Normal. Posterior communicating arteries: Present bilaterally. Posterior cerebral arteries: Normal.  Fetal origin on the left. Basilar artery: Normal. Vertebral arteries: Codominant. Normal. Superior cerebellar arteries: Normal. Anterior inferior cerebellar arteries: Normal. Posterior inferior cerebellar arteries: Normal. IMPRESSION: 1. Acute infarct within the right pons, in keeping with left-sided weakness. Punctate focus of nearby microhemorrhage seen on gradient echo imaging is suspected to be chronic. Regardless, no space-occupying hematoma. 2. Multiple old infarcts, including large left MCA distribution infarct, with associated encephalomalacia. 3. Normal intracranial MRA. Electronically Signed   By: Deatra Robinson M.D.   On: 08/06/2016 05:19   Ct Cerebral Perfusion W Contrast  Result Date: 08/06/2016 CLINICAL DATA:  Left-sided weakness and dysphasia EXAM: CT ANGIOGRAPHY HEAD AND NECK CT PERFUSION BRAIN TECHNIQUE: Multidetector CT imaging of the head and neck was performed using the standard protocol during bolus administration of intravenous contrast. Multiplanar CT image reconstructions and MIPs were obtained to evaluate the vascular anatomy. Carotid stenosis measurements (when applicable) are obtained utilizing NASCET criteria, using the distal  internal carotid  diameter as the denominator. Multiphase CT imaging of the brain was performed following IV bolus contrast injection. Subsequent parametric perfusion maps were calculated using RAPID software. CONTRAST:  100 mL Isovue 370 IV COMPARISON:  None. FINDINGS: CTA NECK FINDINGS Aortic arch: There is no aneurysm or dissection of the visualized ascending aorta or aortic arch. There is a normal 3 vessel branching pattern. The visualized proximal subclavian arteries are normal. Right carotid system: The right common carotid origin is widely patent. There is no common carotid or internal carotid artery dissection or aneurysm. There is mixed calcified and noncalcified plaque at the bifurcation but no hemodynamically significant stenosis. Left carotid system: There is a large calcification along the medial aspect of the left common carotid lumen there results in approximately 60% stenosis. There is no common carotid or internal carotid artery dissection or aneurysm. There is atherosclerotic calcification of the bifurcation but no hemodynamically significant stenosis. Vertebral arteries: The vertebral system is codominant. There is moderate narrowing of both vertebral artery origins secondary to atherosclerotic calcification. There is multifocal atherosclerosis within both V1 segments. The V2 and V3 segments are normal. There is minimal calcification in the left V4 segment. Skeleton: There is no bony spinal canal stenosis. No lytic or blastic lesions. Other neck: The nasopharynx is clear. The oropharynx and hypopharynx are normal. The epiglottis is normal. The supraglottic larynx, glottis and subglottic larynx are normal. No retropharyngeal collection. The parapharyngeal spaces are preserved. The parotid and submandibular glands are normal. No sialolithiasis or salivary ductal dilatation. The thyroid gland is normal. There is no cervical lymphadenopathy. Upper chest: No pneumothorax or pleural effusion. No nodules or masses. Review  of the MIP images confirms the above findings CTA HEAD FINDINGS Anterior circulation: --Intracranial internal carotid arteries: Mild atherosclerotic calcification without hemodynamically significant stenosis. --Anterior cerebral arteries: Normal. --Middle cerebral arteries: Normal. --Posterior communicating arteries: Present bilaterally Posterior circulation: --Posterior cerebral arteries: Normal. --Superior cerebellar arteries: Normal. --Basilar artery: Normal. --Anterior inferior cerebellar arteries: Normal. --Posterior inferior cerebellar arteries: Normal. Venous sinuses: As permitted by contrast timing, patent. Anatomic variants: Fetal origin of the left vertebral artery. Other findings: There is a superior left convexity extra-axial contrast-enhancing mass that measures 13 x 10 mm. Review of the MIP images confirms the above findings CT Brain Perfusion Findings: CBF (<30%) Volume: 0mL Perfusion (Tmax>6.0s) volume: 0mL Mismatch Volume: 0mL Infarction Location:None IMPRESSION: 1. Normal perfusion scan of the brain. 2. No intracranial or cervical large vessel occlusion. 3. Right carotid bifurcation atherosclerosis without hemodynamically significant stenosis. Chunky calcification within the left common carotid artery, resulting in 60% stenosis. 4. Bilateral moderate narrowing of the vertebral artery origins due to atherosclerotic calcification. Otherwise normal vertebral arteries. 5. Extra-axial contrast-enhancing mass at the superior left convexity, likely meningioma. Electronically Signed   By: Deatra Robinson M.D.   On: 08/06/2016 00:42   Dg Chest Port 1 View  Result Date: 08/06/2016 CLINICAL DATA:  Dyspnea this evening EXAM: PORTABLE CHEST 1 VIEW COMPARISON:  None. FINDINGS: Borderline cardiomegaly. No aortic aneurysm. Pulmonary vascular congestion consistent with mild CHF. Probable small pleural effusions. No acute nor suspicious osseous abnormality. Degenerative changes are seen along the dorsal spine and  both shoulders. IMPRESSION: Borderline cardiomegaly with mild vascular congestion consistent with CHF. Probable bilateral small pleural effusions. Electronically Signed   By: Tollie Eth M.D.   On: 08/06/2016 02:41   Dg Swallowing Func-speech Pathology  Result Date: 08/07/2016 Objective Swallowing Evaluation: Type of Study: MBS-Modified Barium Swallow Study Patient Details Name: Malik Bruce  MRN: [id number] Date of Birth: 05-19-1950 Today's Date: 08/07/2016 Time: SLP Start Time (ACUTE ONLY): 1040-SLP Stop Time (ACUTE ONLY): 1100 SLP Time Calculation (min) (ACUTE ONLY): 20 min Past Medical History: Past Medical History: Diagnosis Date . CVA (cerebral vascular accident) (HCC)  . DM type 2 (diabetes mellitus, type 2) (HCC)  . HTN (hypertension)  Past Surgical History: No past surgical history on file. HPI: Pt is a 66 y.o.maleadmitted with acute onset of left sided weakness, aphasia, and facial droop. 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 No prior SLP notes are available, but pt denies any h/o dysphagia. Subjective: alert, difficult to understand Assessment / Plan / Recommendation CHL IP CLINICAL IMPRESSIONS 08/07/2016 Clinical Impression Pt demonstrates a moderate to severe oral and oropharyngeal dysphagia. Lingual weakness results in impared manipulation and formation of bolus with premature spillage, pumping mechanism and oral residuals. Oropharyngeal phase characterzied by delayed swallow initiation leading to aspiration before the swallow with thin liquid and large nectar thick boluses; sensation only with large quantity of aspiration. There is also significant base of tongue weakenss with mdoerate vallcuelar residuals. Also impacting pts function is difficulty following instructions due to aphasia and impulsivity and decreased awareness with self feeding. SLP had to provide max assist to decrease bolus size, resorting finally to spoon  feeding. Pt also need max visual and tactile assist to attempt positional strategies (chin tuck, head turn/tilt - not effective in reducing residue). Pt also could not achieve a consistent second swallow or effortful swallo with commands and volitional cough was very weak. Pt will be at risk of aspiration with all PO consistencies, but best conservative consistency to test for toelrance is dys 1 (puree) with nectar thick liquids via teaspoon. Pt will need base of tongue exercises and traning for a second swallow, rate control and possible RMT. Will follow for tolerance and therapy.  SLP Visit Diagnosis Dysphagia, oropharyngeal phase (R13.12) Attention and concentration deficit following -- Frontal lobe and executive function deficit following -- Impact on safety and function Severe aspiration risk   CHL IP TREATMENT RECOMMENDATION 08/07/2016 Treatment Recommendations Therapy as outlined in treatment plan below   Prognosis 08/07/2016 Prognosis for Safe Diet Advancement Good Barriers to Reach Goals -- Barriers/Prognosis Comment -- CHL IP DIET RECOMMENDATION 08/07/2016 SLP Diet Recommendations Dysphagia 1 (Puree) solids;Nectar thick liquid Liquid Administration via Spoon Medication Administration Crushed with puree Compensations Slow rate;Small sips/bites;Monitor for anterior loss;Multiple dry swallows after each bite/sip Postural Changes Seated upright at 90 degrees;Remain semi-upright after after feeds/meals (Comment)   CHL IP OTHER RECOMMENDATIONS 08/07/2016 Recommended Consults -- Oral Care Recommendations Oral care BID Other Recommendations Have oral suction available   CHL IP FOLLOW UP RECOMMENDATIONS 08/07/2016 Follow up Recommendations Inpatient Rehab   CHL IP FREQUENCY AND DURATION 08/07/2016 Speech Therapy Frequency (ACUTE ONLY) min 2x/week Treatment Duration 2 weeks      CHL IP ORAL PHASE 08/07/2016 Oral Phase Impaired Oral - Pudding Teaspoon -- Oral - Pudding Cup -- Oral - Honey Teaspoon -- Oral - Honey Cup --  Oral - Nectar Teaspoon Weak lingual manipulation;Lingual pumping;Incomplete tongue to palate contact;Reduced posterior propulsion;Right pocketing in lateral sulci;Lingual/palatal residue;Delayed oral transit;Decreased bolus cohesion;Premature spillage Oral - Nectar Cup Weak lingual manipulation;Lingual pumping;Incomplete tongue to palate contact;Reduced posterior propulsion;Right pocketing in lateral sulci;Lingual/palatal residue;Delayed oral transit;Decreased bolus cohesion;Premature spillage;Right anterior bolus loss Oral - Nectar Straw Impaired mastication;Lingual pumping;Incomplete tongue to palate contact;Reduced posterior propulsion;Right pocketing in lateral sulci;Lingual/palatal residue;Delayed oral transit;Decreased  bolus cohesion;Premature spillage Oral - Thin Teaspoon Lingual pumping;Incomplete tongue to palate contact;Reduced posterior propulsion;Right pocketing in lateral sulci;Lingual/palatal residue;Delayed oral transit;Decreased bolus cohesion;Premature spillage;Weak lingual manipulation Oral - Thin Cup Right anterior bolus loss;Impaired mastication;Weak lingual manipulation;Lingual pumping;Incomplete tongue to palate contact;Reduced posterior propulsion;Right pocketing in lateral sulci;Lingual/palatal residue;Delayed oral transit;Decreased bolus cohesion;Premature spillage Oral - Thin Straw Weak lingual manipulation;Lingual pumping;Incomplete tongue to palate contact;Reduced posterior propulsion;Right pocketing in lateral sulci;Lingual/palatal residue;Delayed oral transit;Decreased bolus cohesion;Premature spillage Oral - Puree Weak lingual manipulation;Lingual pumping;Incomplete tongue to palate contact;Reduced posterior propulsion;Right pocketing in lateral sulci;Delayed oral transit;Decreased bolus cohesion;Premature spillage;Lingual/palatal residue Oral - Mech Soft -- Oral - Regular Impaired mastication;Weak lingual manipulation;Lingual pumping;Incomplete tongue to palate contact;Reduced  posterior propulsion;Right pocketing in lateral sulci;Lingual/palatal residue;Delayed oral transit;Decreased bolus cohesion;Premature spillage Oral - Multi-Consistency -- Oral - Pill -- Oral Phase - Comment --  CHL IP PHARYNGEAL PHASE 08/07/2016 Pharyngeal Phase Impaired Pharyngeal- Pudding Teaspoon -- Pharyngeal -- Pharyngeal- Pudding Cup -- Pharyngeal -- Pharyngeal- Honey Teaspoon -- Pharyngeal -- Pharyngeal- Honey Cup -- Pharyngeal -- Pharyngeal- Nectar Teaspoon Delayed swallow initiation-pyriform sinuses;Reduced epiglottic inversion;Reduced tongue base retraction;Pharyngeal residue - valleculae;Pharyngeal residue - pyriform Pharyngeal -- Pharyngeal- Nectar Cup Delayed swallow initiation-pyriform sinuses;Reduced epiglottic inversion;Reduced tongue base retraction;Pharyngeal residue - valleculae;Pharyngeal residue - pyriform;Penetration/Aspiration before swallow;Significant aspiration (Amount);Compensatory strategies attempted (with notebox) Pharyngeal Material enters airway, passes BELOW cords and not ejected out despite cough attempt by patient Pharyngeal- Nectar Straw Delayed swallow initiation-pyriform sinuses;Reduced epiglottic inversion;Reduced tongue base retraction;Pharyngeal residue - valleculae;Pharyngeal residue - pyriform;Penetration/Aspiration before swallow;Significant aspiration (Amount);Compensatory strategies attempted (with notebox);Penetration/Aspiration during swallow;Trace aspiration Pharyngeal Material enters airway, passes BELOW cords without attempt by patient to eject out (silent aspiration);Material enters airway, CONTACTS cords and not ejected out;Material enters airway, remains ABOVE vocal cords and not ejected out Pharyngeal- Thin Teaspoon Delayed swallow initiation-pyriform sinuses;Reduced epiglottic inversion;Reduced tongue base retraction;Pharyngeal residue - valleculae;Pharyngeal residue - pyriform;Penetration/Aspiration before swallow;Compensatory strategies attempted (with  notebox);Penetration/Aspiration during swallow;Trace aspiration;Reduced airway/laryngeal closure;Inter-arytenoid space residue Pharyngeal Material enters airway, passes BELOW cords without attempt by patient to eject out (silent aspiration);Material enters airway, CONTACTS cords and not ejected out Pharyngeal- Thin Cup Delayed swallow initiation-pyriform sinuses;Reduced epiglottic inversion;Reduced tongue base retraction;Pharyngeal residue - valleculae;Pharyngeal residue - pyriform;Penetration/Aspiration before swallow;Compensatory strategies attempted (with notebox);Penetration/Aspiration during swallow;Trace aspiration;Reduced airway/laryngeal closure;Inter-arytenoid space residue Pharyngeal Material enters airway, passes BELOW cords without attempt by patient to eject out (silent aspiration);Material enters airway, CONTACTS cords and not ejected out Pharyngeal- Thin Straw Delayed swallow initiation-pyriform sinuses;Reduced epiglottic inversion;Reduced tongue base retraction;Pharyngeal residue - valleculae;Pharyngeal residue - pyriform;Penetration/Aspiration before swallow;Compensatory strategies attempted (with notebox);Penetration/Aspiration during swallow;Trace aspiration;Reduced airway/laryngeal closure;Inter-arytenoid space residue Pharyngeal -- Pharyngeal- Puree Delayed swallow initiation-pyriform sinuses;Reduced epiglottic inversion;Reduced tongue base retraction;Pharyngeal residue - valleculae;Pharyngeal residue - pyriform Pharyngeal -- Pharyngeal- Mechanical Soft -- Pharyngeal -- Pharyngeal- Regular Delayed swallow initiation-pyriform sinuses;Reduced epiglottic inversion;Reduced tongue base retraction;Pharyngeal residue - valleculae;Pharyngeal residue - pyriform Pharyngeal -- Pharyngeal- Multi-consistency -- Pharyngeal -- Pharyngeal- Pill -- Pharyngeal -- Pharyngeal Comment --  No flowsheet data found. No flowsheet data found. Harlon Ditty, [place] CCC-SLP [phone] Claudine Mouton 08/07/2016, 11:58  AM              Mr Malik Bruce Head/brain Wo Cm  Result Date: 08/06/2016 CLINICAL DATA:  A aphasia and left-sided weakness. Left facial droop. EXAM: MRI HEAD WITHOUT CONTRAST MRA HEAD WITHOUT CONTRAST TECHNIQUE: Multiplanar, multiecho pulse sequences of the brain and surrounding structures were obtained without intravenous contrast. Angiographic images of the head were obtained using MRA technique without contrast. COMPARISON:  Head CT 08/06/2016  FINDINGS: MRI HEAD FINDINGS Brain: The midline structures are normal. There is a small focus of diffusion restriction measuring approximately 8 mm within the right pons. No other areas of diffusion restriction. There is encephalomalacia at the site of old left MCA infarct. There are multiple old bilateral lacunar infarcts. There is a large amount of hyperintense T2 weighted signal within the left frontoparietal white matter. There is also a small old right cerebellar infarct. There is a punctate focus of petechial hemorrhage within the right pons. There is hemosiderin deposition in the left MCA distribution. No mass lesion. No chronic microhemorrhage or cerebral amyloid angiopathy. No hydrocephalus, age advanced atrophy or lobar predominant volume loss. No dural abnormality or extra-axial collection. Skull and upper cervical spine: The visualized skull base, calvarium, upper cervical spine and extracranial soft tissues are normal. Sinuses/Orbits: No fluid levels or advanced mucosal thickening. No mastoid effusion. Normal orbits. MRA HEAD FINDINGS Intracranial internal carotid arteries: Normal. Anterior cerebral arteries: Normal. Middle cerebral arteries: Normal. Posterior communicating arteries: Present bilaterally. Posterior cerebral arteries: Normal.  Fetal origin on the left. Basilar artery: Normal. Vertebral arteries: Codominant. Normal. Superior cerebellar arteries: Normal. Anterior inferior cerebellar arteries: Normal. Posterior inferior cerebellar arteries: Normal.  IMPRESSION: 1. Acute infarct within the right pons, in keeping with left-sided weakness. Punctate focus of nearby microhemorrhage seen on gradient echo imaging is suspected to be chronic. Regardless, no space-occupying hematoma. 2. Multiple old infarcts, including large left MCA distribution infarct, with associated encephalomalacia. 3. Normal intracranial MRA. Electronically Signed   By: Deatra Robinson M.D.   On: 08/06/2016 05:19   Ct Head Code Stroke W/o Cm  Result Date: 08/06/2016 CLINICAL DATA:  Code stroke. Left-sided weakness, drooling and slurred speech EXAM: CT HEAD WITHOUT CONTRAST TECHNIQUE: Contiguous axial images were obtained from the base of the skull through the vertex without intravenous contrast. COMPARISON:  None. FINDINGS: Brain: There is encephalomalacia and the left MCA distribution, likely chronic infarct. There are multiple age-indeterminate lacunar infarcts of the right basal ganglia and adjacent white matter. No midline shift or mass effect. No acute hemorrhage. Vascular: No hyperdense vessel. Atherosclerotic calcification of the vertebral and internal carotid arteries at the skull base. Skull: Normal. Negative for fracture or focal lesion. Sinuses/Orbits: The visualized portions of the paranasal sinuses and mastoid air cells are free of fluid. No advanced mucosal thickening. The visualized orbits are normal. Other: None ASPECTS (Alberta Stroke Program Early CT Score) - Ganglionic level infarction (caudate, lentiform nuclei, internal capsule, insula, M1-M3 cortex): 6 - Supraganglionic infarction (M4-M6 cortex): 2 Total score (0-10 with 10 being normal): 8 IMPRESSION: 1. Age indeterminate lacunar infarcts of the right basal ganglia and thalamus. MRI is recommended for more definitive temporal characterization. 2. No acute hemorrhage. 3. Multiple old infarcts including the left MCA distribution and left basal ganglia/thalamus. 4. ASPECTS is 8. These results were called by telephone at the  time of interpretation on 08/06/2016 at 12:11 am to Dr. Caryl Pina who verbally acknowledged these results. Electronically Signed   By: Deatra Robinson M.D.   On: 08/06/2016 00:11    Scheduled Meds: . chlorhexidine  15 mL Mouth Rinse BID  . mouth rinse  15 mL Mouth Rinse q12n4p   Continuous Infusions:   LOS: 1 day    Time spent: > 35 minutes  Penny Pia, MD [facility] Pager [phone] If 7PM-7AM, please contact night-coverage [email or web address] Password [facility] 08/07/2016, 3:39 PM

## 2016-08-07 NOTE — Progress Notes (Addendum)
Initial Nutrition Assessment  DOCUMENTATION CODES:   Not applicable  INTERVENTION:  Provide Glucerna Shake po TID (thickened to appropriate consistency), each supplement provides 220 kcal and 10 grams of protein.  Encourage adequate PO intake.   NUTRITION DIAGNOSIS:   Inadequate oral intake related to dysphagia as evidenced by  (dysphagia 1 diet).  GOAL:   Patient will meet greater than or equal to 90% of their needs  MONITOR:   PO intake, Supplement acceptance, Diet advancement, Labs, Weight trends, Skin, I & O's  REASON FOR ASSESSMENT:   Low Braden    ASSESSMENT:   66 y.o. male with medical history significant of CVA with residual right-sided hemiparesis, DM type II, and HTN; who presented as a code stroke from [facility] nursing facility. He presented with new stroke  Pt was unavailable during attempted time of visit. RD unable to obtain nutrition history. MBS done today. Per SLP note, pt with moderate to severe oral and oropharyngeal dysphagia. Pt is currently on a dysphagia 1 diet with nectar thick liquids. RD to order nutritional supplements to aid in caloric and protein needs.   Unable to complete Nutrition-Focused physical exam at this time.   Labs and medications reviewed.   Diet Order:  DIET - DYS 1 Room service appropriate? Yes; Fluid consistency: Nectar Thick  Skin:  Wound (see comment) (Diabetic ulcer R tibia)  Last BM:  4/26  Height:   Ht Readings from Last 1 Encounters:  08/06/16 6' (1.829 m)    Weight:   Wt Readings from Last 1 Encounters:  08/06/16 164 lb 7.4 oz (74.6 kg)    Ideal Body Weight:  80.9 kg  BMI:  Body mass index is 22.31 kg/m.  Estimated Nutritional Needs:   Kcal:  1900-2050  Protein:  85-100 grams  Fluid:  1.9 - 2 L/day  EDUCATION NEEDS:   No education needs identified at this time  Roslyn Smiling, MS, RD, LDN Pager # [phone] After hours/ weekend pager # [phone]

## 2016-08-07 NOTE — Evaluation (Signed)
Physical Therapy Evaluation Patient Details Name: Malik Bruce MRN: [id number] DOB: [birth year] Today's Date: 08/07/2016   History of Present Illness  Patient is a 66 yo male admitted 08/06/16 from SNF with decreased speech.  Patient with new acute infarct in Rt Pons.    PMH:  Lt MCA CVA with Rt hemiparesis, DM, HTN  Clinical Impression  Patient presents with problems listed below.  Will benefit from acute PT to maximize functional mobility prior to return to SNF.    Follow Up Recommendations SNF    Equipment Recommendations  None recommended by PT    Recommendations for Other Services       Precautions / Restrictions Precautions Precautions: Fall Restrictions Weight Bearing Restrictions: No      Mobility  Bed Mobility Overal bed mobility: Needs Assistance Bed Mobility: Supine to Sit;Sit to Supine     Supine to sit: Mod assist;+2 for physical assistance Sit to supine: Mod assist;+2 for physical assistance   General bed mobility comments: Assist to assist trunk and LE's during transitions.  In sitting, patient able to maintain sitting balance with min guard assist.  Able to perform dynamic activities with verbal and visual cues.  Transfers                 General transfer comment: NT  Ambulation/Gait                Stairs            Wheelchair Mobility    Modified Rankin (Stroke Patients Only) Modified Rankin (Stroke Patients Only) Pre-Morbid Rankin Score: Moderately severe disability Modified Rankin: Moderately severe disability     Balance Overall balance assessment: Needs assistance Sitting-balance support: No upper extremity supported;Feet supported Sitting balance-Leahy Scale: Good                                       Pertinent Vitals/Pain Pain Assessment: No/denies pain    Home Living Family/patient expects to be discharged to:: Skilled nursing facility     Type of Home: Skilled Nursing Facility                 Prior Function Level of Independence: Needs assistance         Comments: Unsure of PLOF - patient unable to provide information.     Hand Dominance        Extremity/Trunk Assessment   Upper Extremity Assessment Upper Extremity Assessment: Defer to OT evaluation    Lower Extremity Assessment Lower Extremity Assessment: Generalized weakness;RLE deficits/detail RLE Deficits / Details: Strength grossly 2+/5.  Noted edema in LE       Communication   Communication: Expressive difficulties;Receptive difficulties  Cognition Arousal/Alertness: Awake/alert Behavior During Therapy: Flat affect Overall Cognitive Status: Difficult to assess                                        General Comments      Exercises     Assessment/Plan    PT Assessment Patient needs continued PT services  PT Problem List Decreased strength;Decreased range of motion;Decreased activity tolerance;Decreased balance;Decreased mobility;Decreased coordination;Decreased knowledge of use of DME;Impaired tone       PT Treatment Interventions Functional mobility training;Therapeutic activities;Therapeutic exercise;Balance training;Neuromuscular re-education;Patient/family education    PT Goals (Current goals can be found in the  Care Plan section)  Acute Rehab PT Goals Patient Stated Goal: Unable to state PT Goal Formulation: Patient unable to participate in goal setting Time For Goal Achievement: 08/14/16 Potential to Achieve Goals: Fair    Frequency Min 2X/week   Barriers to discharge        Co-evaluation               End of Session   Activity Tolerance: Patient tolerated treatment well Patient left: in bed;with call bell/phone within reach;with bed alarm set Nurse Communication: Mobility status PT Visit Diagnosis: Other abnormalities of gait and mobility (R26.89);Muscle weakness (generalized) (M62.81);Hemiplegia and hemiparesis Hemiplegia - Right/Left:  Right Hemiplegia - dominant/non-dominant: Dominant Hemiplegia - caused by: Cerebral infarction    Time: 7829-5621 PT Time Calculation (min) (ACUTE ONLY): 16 min   Charges:   PT Evaluation $PT Eval Moderate Complexity: 1 Procedure     PT G Codes:        Malik Bruce. Malik Bruce, [facility] Acute Rehab Services Pager [phone]   Malik Bruce 08/07/2016, 8:45 PM

## 2016-08-08 ENCOUNTER — Encounter (HOSPITAL_COMMUNITY): Payer: Self-pay | Admitting: *Deleted

## 2016-08-08 ENCOUNTER — Inpatient Hospital Stay (HOSPITAL_COMMUNITY): Payer: Medicare Other

## 2016-08-08 DIAGNOSIS — I6789 Other cerebrovascular disease: Secondary | ICD-10-CM

## 2016-08-08 LAB — ECHOCARDIOGRAM COMPLETE
AVLVOTPG: 5 mmHg
CHL CUP DOP CALC LVOT VTI: 20.7 cm
FS: 26 % — AB (ref 28–44)
HEIGHTINCHES: 72 in
IVS/LV PW RATIO, ED: 0.92
LA ID, A-P, ES: 37 mm
LA diam end sys: 37 mm
LA vol A4C: 49.7 ml
LADIAMINDEX: 1.9 cm/m2
LAVOL: 51.5 mL
LAVOLIN: 26.5 mL/m2
LV PW d: 10.2 mm — AB (ref 0.6–1.1)
LVELAT: 8.48 cm/s
LVOT SV: 65 mL
LVOT area: 3.14 cm2
LVOT peak vel: 115 cm/s
LVOTD: 20 mm
RV LATERAL S' VELOCITY: 15.9 cm/s
RV TAPSE: 25.4 mm
TDI e' lateral: 8.48
Weight: 2631.41 oz

## 2016-08-08 LAB — HEMOGLOBIN A1C
Hgb A1c MFr Bld: 5.3 % (ref 4.8–5.6)
MEAN PLASMA GLUCOSE: 105 mg/dL

## 2016-08-08 NOTE — Progress Notes (Signed)
SLP Cancellation Note  Patient Details Name: ZEPLIN ALESHIRE MRN: [id number] DOB: Feb 16, 1951   Cancelled treatment:       Reason Eval/Treat Not Completed: Patient at procedure or test/unavailable (pt at [facility] lab currently, will continue efforts)   Donavan Burnet, MS [facility] SLP [phone]

## 2016-08-08 NOTE — Clinical Social Work Note (Signed)
Clinical Social Work Assessment  Patient Details  Name: Malik Bruce MRN: [id number] Date of Birth: [birth year]  Date of referral:  08/08/16               Reason for consult:  Facility Placement                Permission sought to share information with:  Facility Medical sales representative, Family Supports Permission granted to share information::  No  Name::     Geophysicist/field seismologist::  SNFs  Relationship::  Brother  Contact Information:  [phone]  Housing/Transportation Living arrangements for the past 2 months:  Assisted Living Facility Source of Information:  Other (Comment Required), Facility Patient Interpreter Needed:  None Criminal Activity/Legal Involvement Pertinent to Current Situation/Hospitalization:  No - Comment as needed Significant Relationships:  Siblings Lives with:  Facility Resident Do you feel safe going back to the place where you live?  No Need for family participation in patient care:  Yes (Comment)  Care giving concerns:  CSW received consult for possible SNF placement at time of discharge. CSW spoke with patient's brother regarding PT recommendation of SNF placement at time of discharge. Patient resides at [facility]. Patient's brother expressed understanding of PT recommendation and is agreeable to SNF placement at time of discharge. He feels the patient would do better getting rehab every day. CSW to continue to follow and assist with discharge planning needs.   Social Worker assessment / plan:  CSW spoke with patient's brother concerning possibility of rehab at [facility] before returning to ALF.    Employment status:  Retired Health and safety inspector:  Armed forces operational officer, [employer] Benefit PT Recommendations:  Skilled Nursing Facility Information / Referral to community resources:  Skilled Nursing Facility  Patient/Family's Response to care:  Patient recognizes need for rehab before returning home and is agreeable to a SNF. Patient's brother expressed a preference of [facility] since patient went there last year and did well.  Patient/Family's Understanding of and Emotional Response to Diagnosis, Current Treatment, and Prognosis:  Patient/family is realistic regarding therapy needs and expressed being hopeful for SNF placement. Patient's brother expressed understanding of CSW role and discharge process. He reported that he may begin the process of finding a long term care placement instead of having patient return to ALF level of care. No questions/concerns about plan or treatment.    Emotional Assessment Appearance:  Appears stated age Attitude/Demeanor/Rapport:  Unable to Assess Affect (typically observed):  Unable to Assess Orientation:  Oriented to Self, Oriented to Place Alcohol / Substance use:  Not Applicable Psych involvement (Current and /or in the community):  No (Comment)  Discharge Needs  Concerns to be addressed:  Care Coordination Readmission within the last 30 days:  No Current discharge risk:  None Barriers to Discharge:  Continued Medical Work up   [employer], LCSWA 08/08/2016, 4:05 PM

## 2016-08-08 NOTE — Progress Notes (Signed)
  Speech Language Pathology Treatment: Dysphagia  Patient Details Name: DAQUANE AGUILAR MRN: [id number] DOB: [birth year] Today's Date: 08/08/2016 Time: 1308-6578 SLP Time Calculation (min) (ACUTE ONLY): 12 min  Assessment / Plan / Recommendation Clinical Impression  Pt seen for dysphagia treatment, sitting upright in chair having just completed OT tx. Pt with mild amount of secretions pooling in oral cavity. SLP completed oral care to maximize safety for PO intake. Pt requires full assistance for bolus retrieval via teaspoon. Pt with prolonged/impaired bolus formation, suspected delayed swallow initiation with nectar thick liquids and pureed solids, which he manages without overt signs of aspiration. Requires extended time for oral clearance. Spoke with RN regarding strict swallowing precautions, sign placed at [facility]. Recommend continuing with dys 1 with nectar thick liquids via teaspoon only with full assistance for feeding and to support use of swallowing precautions, medications crushed in pureed. SLP will f/u for tolerance, base of tongue strengthening exercises.     HPI HPI: Pt is a 66 y.o.maleadmitted with acute onset of left sided weakness, aphasia, and facial droop. 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 No prior SLP notes are available, but pt denies any h/o dysphagia.      SLP Plan  Continue with current plan of care       Recommendations  Diet recommendations: Dysphagia 1 (puree);Nectar-thick liquid Liquids provided via: Teaspoon Medication Administration: Crushed with puree Supervision: Full supervision/cueing for compensatory strategies;Trained caregiver to feed patient Compensations: Slow rate;Small sips/bites;Monitor for anterior loss;Multiple dry swallows after each bite/sip Postural Changes and/or Swallow Maneuvers: Seated upright 90 degrees                General recommendations: Rehab  consult Oral Care Recommendations: Oral care BID Follow up Recommendations: Inpatient Rehab SLP Visit Diagnosis: Dysphagia, oropharyngeal phase (R13.12) Plan: Continue with current plan of care       GO              Rondel Baton, MS CF-SLP Speech-Language Pathologist [phone]  Arlana Lindau 08/08/2016, 12:44 PM

## 2016-08-08 NOTE — Progress Notes (Signed)
  Echocardiogram 2D Echocardiogram has been performed.  Delcie Roch 08/08/2016, 9:11 AM

## 2016-08-08 NOTE — Evaluation (Signed)
Occupational Therapy Evaluation Patient Details Name: Malik Bruce MRN: [id number] DOB: 1950/05/07 Today's Date: 08/08/2016    History of Present Illness Patient is a 66 yo male admitted 08/06/16 from SNF with decreased speech.  Patient with new acute infarct in Rt Pons.    PMH:  Lt MCA CVA with Rt hemiparesis, DM, HTN   Clinical Impression   PTA, pt was living in ALF. Pt unable to report PLOF at time of OT evaluation. Pt currently requires mod assist for bed mobility and max-total assist with ADL. He presents with decreased ability to follow commands, rigid R UE (at baseline), and decreased L UE strength and coordination impacting his ability to complete self-care tasks. Pt would benefit from continued OT services while admitted to improve independence with ADL and functional mobility. Recommend SNF placement for continued rehabilitation post-acute D/C. Will continue to follow while admitted.    Follow Up Recommendations  SNF;Supervision/Assistance - 24 hour    Equipment Recommendations  Other (comment) (TBD at next venue of care)    Recommendations for Other Services       Precautions / Restrictions Precautions Precautions: Fall Restrictions Weight Bearing Restrictions: No      Mobility Bed Mobility Overal bed mobility: Needs Assistance Bed Mobility: Supine to Sit     Supine to sit: Mod assist     General bed mobility comments: Pt initiating movement and able to lower legs and pull to a sidelying position. Required mod assist to raise trunk from bed.  Transfers Overall transfer level: Needs assistance Equipment used: 2 person hand held assist Transfers: Sit to/from [employer] Sit to Stand: Max assist;+2 physical assistance Stand pivot transfers: Total assist;+2 physical assistance       General transfer comment: Able to initiate standing with max assistance. Unable to follow commands to pivot.    Balance Overall balance assessment: Needs  assistance Sitting-balance support: No upper extremity supported;Feet supported Sitting balance-Leahy Scale: Fair Sitting balance - Comments: Initially min-mod assist to maintain but able to progress to supervision level.                                   ADL either performed or assessed with clinical judgement   ADL Overall ADL's : Needs assistance/impaired Eating/Feeding: Maximal assistance;Sitting   Grooming: Total assistance;Maximal assistance;Sitting Grooming Details (indicate cue type and reason): Completed spontaneous hand to face movement but unable to follow commands to wash face with multimodal cues Upper Body Bathing: Total assistance;Sitting   Lower Body Bathing: Total assistance;Sit to/from stand   Upper Body Dressing : Total assistance;Sitting   Lower Body Dressing: Total assistance;Sit to/from stand   Toilet Transfer: Maximal assistance;+2 for physical assistance;Stand-pivot   Toileting- Clothing Manipulation and Hygiene: Total assistance;Sit to/from stand         General ADL Comments: Pt requiring max-total assist with ADL at this time due to cognitive and motor deficits.     Vision   Vision Assessment?: Vision impaired- to be further tested in functional context Additional Comments: Noted L gaze preference. Unsure if this is baseline as pt with history of L MCA CVA.     Perception     Praxis      Pertinent Vitals/Pain Pain Assessment: Faces Faces Pain Scale: Hurts a little bit Pain Location: unable to state Pain Intervention(s): Limited activity within patient's tolerance;Monitored during session;Repositioned     Hand Dominance     Extremity/Trunk Assessment  Upper Extremity Assessment Upper Extremity Assessment: RUE deficits/detail;LUE deficits/detail RUE Deficits / Details: Rigid in flexion and resistant to motion in all planes. Tightly fisted hand. LUE Deficits / Details: Decreased coordination and grossly weak (4/5). Able to  reach for items and hold himself up in sitting position but decreased coordination. MInimal fine motor movement noted this session. LUE Coordination: decreased fine motor;decreased gross motor   Lower Extremity Assessment Lower Extremity Assessment: Defer to PT evaluation       Communication Communication Communication: Expressive difficulties;Receptive difficulties   Cognition Arousal/Alertness: Awake/alert Behavior During Therapy: Flat affect Overall Cognitive Status: Difficult to assess                                 General Comments: Pt unable to follow one-step commands during self-care at EOB. Was able to reach for items during session and did make eye contact when therapist called his name. He additionally attempted to report his birth date when asked but OT unable to understand.   General Comments  Recieved verbal clearance from RN to see pt.    Exercises     Shoulder Instructions      Home Living Family/patient expects to be discharged to:: Skilled nursing facility     Type of Home: Skilled Nursing Facility                                  Prior Functioning/Environment Level of Independence: Needs assistance        Comments: Unsure of PLOF - patient unable to provide information.        OT Problem List: Decreased strength;Decreased range of motion;Decreased activity tolerance;Impaired balance (sitting and/or standing);Impaired vision/perception;Decreased coordination;Decreased cognition;Decreased safety awareness;Decreased knowledge of use of DME or AE;Decreased knowledge of precautions;Impaired UE functional use;Pain      OT Treatment/Interventions: Self-care/ADL training;Therapeutic exercise;Energy conservation;Therapeutic activities;Cognitive remediation/compensation;Visual/perceptual remediation/compensation;Patient/family education;Balance training;Neuromuscular education;DME and/or AE instruction    OT Goals(Current goals can  be found in the care plan section) Acute Rehab OT Goals Patient Stated Goal: Unable to state OT Goal Formulation: Patient unable to participate in goal setting Time For Goal Achievement: 08/22/16 Potential to Achieve Goals: Good ADL Goals Pt Will Perform Grooming: with min assist;sitting Pt Will Transfer to Toilet: with min assist;bedside commode;stand pivot transfer Pt Will Perform Toileting - Clothing Manipulation and hygiene: with min assist;sit to/from stand Additional ADL Goal #1: Pt will complete bed mobility with supervision in preparation for ADL seated at EOB. Additional ADL Goal #2: Pt will follow 3/5 one-step commands during ADL tasks with no more than 1 VC.  OT Frequency: Min 2X/week   Barriers to D/C:            Co-evaluation              End of Session Equipment Utilized During Treatment: Gait belt Nurse Communication: Mobility status (Will need max-total assist +2 for stand-pivot to strong side)  Activity Tolerance: Patient tolerated treatment well Patient left: in chair;with call bell/phone within reach;with chair alarm set  OT Visit Diagnosis: Hemiplegia and hemiparesis;Unsteadiness on feet (R26.81);Cognitive communication deficit (R41.841) Symptoms and signs involving cognitive functions: Cerebral infarction Hemiplegia - Right/Left: Right Hemiplegia - caused by: Cerebral infarction                Time: 4098-1191 OT Time Calculation (min): 25 min Charges:  OT General Charges $OT  Visit: 1 Procedure OT Evaluation $OT Eval Moderate Complexity: 1 Procedure OT Treatments $Self Care/Home Management : 8-22 mins G-Codes:     Doristine Section, MS OTR/L  Pager: [phone]   [place] A Jamontae Thwaites 08/08/2016, 12:24 PM

## 2016-08-08 NOTE — Progress Notes (Signed)
Patient resides at [facility] [phone]).  Malik Bruce Kashawn Manzano LCSWA [phone]

## 2016-08-08 NOTE — Progress Notes (Addendum)
Telemetry report from telemetry monitor call; patient had 1.5 second pause at 0613 am; MD made aware; continue to monitor.

## 2016-08-08 NOTE — [place] FL2 (Signed)
  [place] MEDICAID [id number] LEVEL OF CARE SCREENING TOOL     IDENTIFICATION  Patient Name: Malik Bruce Birthdate: 01/02/1951 Sex: male Admission Date (Current Location): 08/06/2016  [facility] and [place] Number:  Chiropodist and Address:  [facility] [place], [address] [place], [place] [address]      Provider Number: [id number]  Attending Physician Name and Address:  Penny Pia, MD  Relative Name and Phone Number:  Aurther Loft, brother, [phone]    Current Level of Care: Hospital Recommended Level of Care: Skilled Nursing Facility Prior Approval Number:    Date Approved/Denied:   PASRR Number: [id number]   Discharge Plan: SNF    Current Diagnoses: Patient Active Problem List   Diagnosis Date Noted  . CVA (cerebral vascular accident) (HCC) 08/06/2016  . Acute metabolic encephalopathy 08/06/2016  . Leukocytosis 08/06/2016  . Anemia due to chronic kidney disease 08/06/2016  . Renal insufficiency 08/06/2016  . Hyperlipidemia 08/06/2016  . CVA, old, hemiparesis (HCC) 08/06/2016    Orientation RESPIRATION BLADDER Height & Weight     Self, Place  Normal Incontinent Weight: 74.6 kg (164 lb 7.4 oz) Height:  6' (182.9 cm)  BEHAVIORAL SYMPTOMS/MOOD NEUROLOGICAL BOWEL NUTRITION STATUS      Incontinent Diet (Please see DC Summary)  AMBULATORY STATUS COMMUNICATION OF NEEDS Skin   Extensive Assist Verbally Normal                       Personal Care Assistance Level of Assistance  Bathing, Feeding, Dressing Bathing Assistance: Maximum assistance Feeding assistance: Limited assistance Dressing Assistance: Limited assistance     Functional Limitations Info  Speech     Speech Info: Impaired    SPECIAL CARE FACTORS FREQUENCY  PT (By licensed PT), Speech therapy, OT (By licensed OT)     PT Frequency: 5x/week OT Frequency: 3x/week     Speech Therapy Frequency: 3x/week      Contractures      Additional Factors Info  Code  Status, Allergies Code Status Info: Full Allergies Info: NKA           Current Medications (08/08/2016):  This is the current hospital active medication list Current Facility-Administered Medications  Medication Dose Route Frequency Provider Last Rate Last Dose  . acetaminophen (TYLENOL) tablet 650 mg  650 mg Oral Q4H PRN Clydie Braun, MD       Or  . acetaminophen (TYLENOL) solution 650 mg  650 mg Per Tube Q4H PRN Clydie Braun, MD       Or  . acetaminophen (TYLENOL) suppository 650 mg  650 mg Rectal Q4H PRN Clydie Braun, MD      . chlorhexidine (PERIDEX) 0.12 % solution 15 mL  15 mL Mouth Rinse BID Clydie Braun, MD   15 mL at 08/08/16 1029  . feeding supplement (GLUCERNA SHAKE) (GLUCERNA SHAKE) liquid 237 mL  237 mL Oral TID BM Penny Pia, MD   237 mL at 08/07/16 2019     Discharge Medications: Please see discharge summary for a list of discharge medications.  Relevant Imaging Results:  Relevant Lab Results:   Additional Information Brother has SSN#  Mearl Latin, LCSWA

## 2016-08-08 NOTE — Progress Notes (Signed)
PROGRESS NOTE    Malik Bruce  ZOX:[id number] DOB: 1950-10-13 DOA: 08/06/2016 PCP: No primary care provider on file.   Brief Narrative:  66 y.o. male with medical history significant of CVA with residual right-sided hemiparesis, DM type II, and HTN; who presented as a code stroke from [facility] nursing facility. He presented with new stroke.  Assessment & Plan:   Principal Problem:   CVA (cerebral vascular accident) [facility]) - Patient is still under going workup. Echo completed today. Neurology on board, I am waiting for final recommendations - Speech therapy eval and patient is on dysphagia 1 diet with nectar thick liquids  Active Problems:   Acute metabolic encephalopathy - Secondary to principal problem    Leukocytosis - Resolved most likely stress reaction to principal problem    Anemia due to chronic kidney disease - Stable at 9    Hyperlipidemia - Unable to take oral medications secondary to principal problem    CVA, old, hemiparesis (HCC) - Complicating current clinical nares   DVT prophylaxis: SCDs Code Status: Full Family Communication: Discussed with family member Disposition Plan: Pending further workup from specialist   Consultants:   Neurology   Procedures: Stroke work up   Antimicrobials: None   Subjective: Pt has inability to express himself well  Objective: Vitals:   08/08/16 0156 08/08/16 0529 08/08/16 0940 08/08/16 1314  BP: (!) 152/73 (!) 168/81 (!) 143/80 136/72  Pulse: (!) 103 (!) 104 (!) 103 97  Resp: Temp: 98.9 F (37.2 C) 98.3 F (36.8 C) 98.4 F (36.9 C) 98.3 F (36.8 C)  TempSrc: Axillary Oral Oral Oral  SpO2: 98% 100% 100% 98%  Weight:      Height:        Intake/Output Summary (Last 24 hours) at 08/08/16 1545 Last data filed at 08/08/16 1312  Gross per 24 hour  Intake             8450 ml  Output             1350 ml  Net             7100 ml   Filed Weights   08/06/16 0530  Weight: 74.6 kg (164  lb 7.4 oz)    Examination:  General exam: Appears calm and comfortable, in nad. Respiratory system: Clear to auscultation. Respiratory effort normal. Cardiovascular system: S1 & S2 heard, RRR. No JVD, murmurs Gastrointestinal system: Abdomen is nondistended, soft and nontender. No organomegaly or masses felt. Normal bowel sounds heard. Central nervous system: dysarthria, facial droop. Awake and alert Extremities: Right sided weakness, warm extremities Skin: No rashes, lesions or ulcers, on limited exam. Psychiatry: Unable to assess secondary to dysarthria  Data Reviewed: I have personally reviewed following labs and imaging studies  CBC:  Recent Labs Lab 08/05/16 2346 08/05/16 2356 08/07/16 0305  WBC 15.5*  --  10.3  NEUTROABS 11.9*  --  7.9*  HGB 9.2* 9.5* 9.2*  HCT 29.3* 28.0* 30.0*  MCV 84.7  --  83.8  PLT 300  --  312   Basic Metabolic Panel:  Recent Labs Lab 08/05/16 2346 08/05/16 2356 08/07/16 0305  NA 137 140 137  K 4.4 4.4 4.4  CL 106 106 105  CO2 23  --  23  GLUCOSE 115* 114* 97  BUN 26* 28* 21*  CREATININE 1.40* 1.40* 1.19  CALCIUM 9.2  --  9.1   GFR: Estimated Creatinine Clearance: 63.6 mL/min (by C-G formula based on  SCr of 1.19 mg/dL). Liver Function Tests:  Recent Labs Lab 08/05/16 2346  AST 19  ALT 13*  ALKPHOS 57  BILITOT 0.5  PROT 7.5  ALBUMIN 3.4*   No results for input(s): LIPASE, AMYLASE in the last 168 hours. No results for input(s): AMMONIA in the last 168 hours. Coagulation Profile:  Recent Labs Lab 08/05/16 2346  INR 1.14   Cardiac Enzymes: No results for input(s): CKTOTAL, CKMB, CKMBINDEX, TROPONINI in the last 168 hours. BNP (last 3 results) No results for input(s): PROBNP in the last 8760 hours. HbA1C:  Recent Labs  08/07/16 0304  HGBA1C 5.3   CBG:  Recent Labs Lab 08/05/16 2349 08/07/16 0836  GLUCAP 107* 95   Lipid Profile:  Recent Labs  08/07/16 0305  CHOL 93  HDL 19*  LDLCALC 40  TRIG 409*    CHOLHDL 4.9   Thyroid Function Tests: No results for input(s): TSH, T4TOTAL, FREET4, T3FREE, THYROIDAB in the last 72 hours. Anemia Panel: No results for input(s): VITAMINB12, FOLATE, FERRITIN, TIBC, IRON, RETICCTPCT in the last 72 hours. Sepsis Labs:  Recent Labs Lab 08/06/16 0350  LATICACIDVEN 1.48    Recent Results (from the past 240 hour(s))  MRSA PCR Screening     Status: None   Collection Time: 08/06/16  9:48 AM  Result Value Ref Range Status   MRSA by PCR NEGATIVE NEGATIVE Final    Comment:        The GeneXpert MRSA Assay (FDA approved for NASAL specimens only), is one component of a comprehensive MRSA colonization surveillance program. It is not intended to diagnose MRSA infection nor to guide or monitor treatment for MRSA infections.          Radiology Studies: Dg Swallowing Func-speech Pathology  Result Date: 08/07/2016 Objective Swallowing Evaluation: Type of Study: MBS-Modified Barium Swallow Study Patient Details Name: Malik Bruce MRN: [id number] Date of Birth: 18-Oct-1950 Today's Date: 08/07/2016 Time: SLP Start Time (ACUTE ONLY): 1040-SLP Stop Time (ACUTE ONLY): 1100 SLP Time Calculation (min) (ACUTE ONLY): 20 min Past Medical History: Past Medical History: Diagnosis Date . CVA (cerebral vascular accident) (HCC)  . DM type 2 (diabetes mellitus, type 2) (HCC)  . HTN (hypertension)  Past Surgical History: No past surgical history on file. HPI: Pt is a 66 y.o.maleadmitted with acute onset of left sided weakness, aphasia, and facial droop. 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 No prior SLP notes are available, but pt denies any h/o dysphagia. Subjective: alert, difficult to understand Assessment / Plan / Recommendation CHL IP CLINICAL IMPRESSIONS 08/07/2016 Clinical Impression Pt demonstrates a moderate to severe oral and oropharyngeal dysphagia. Lingual weakness results in impared  manipulation and formation of bolus with premature spillage, pumping mechanism and oral residuals. Oropharyngeal phase characterzied by delayed swallow initiation leading to aspiration before the swallow with thin liquid and large nectar thick boluses; sensation only with large quantity of aspiration. There is also significant base of tongue weakenss with mdoerate vallcuelar residuals. Also impacting pts function is difficulty following instructions due to aphasia and impulsivity and decreased awareness with self feeding. SLP had to provide max assist to decrease bolus size, resorting finally to spoon feeding. Pt also need max visual and tactile assist to attempt positional strategies (chin tuck, head turn/tilt - not effective in reducing residue). Pt also could not achieve a consistent second swallow or effortful swallo with commands and volitional cough was very weak. Pt will be  at risk of aspiration with all PO consistencies, but best conservative consistency to test for toelrance is dys 1 (puree) with nectar thick liquids via teaspoon. Pt will need base of tongue exercises and traning for a second swallow, rate control and possible RMT. Will follow for tolerance and therapy.  SLP Visit Diagnosis Dysphagia, oropharyngeal phase (R13.12) Attention and concentration deficit following -- Frontal lobe and executive function deficit following -- Impact on safety and function Severe aspiration risk   CHL IP TREATMENT RECOMMENDATION 08/07/2016 Treatment Recommendations Therapy as outlined in treatment plan below   Prognosis 08/07/2016 Prognosis for Safe Diet Advancement Good Barriers to Reach Goals -- Barriers/Prognosis Comment -- CHL IP DIET RECOMMENDATION 08/07/2016 SLP Diet Recommendations Dysphagia 1 (Puree) solids;Nectar thick liquid Liquid Administration via Spoon Medication Administration Crushed with puree Compensations Slow rate;Small sips/bites;Monitor for anterior loss;Multiple dry swallows after each bite/sip  Postural Changes Seated upright at 90 degrees;Remain semi-upright after after feeds/meals (Comment)   CHL IP OTHER RECOMMENDATIONS 08/07/2016 Recommended Consults -- Oral Care Recommendations Oral care BID Other Recommendations Have oral suction available   CHL IP FOLLOW UP RECOMMENDATIONS 08/07/2016 Follow up Recommendations Inpatient Rehab   CHL IP FREQUENCY AND DURATION 08/07/2016 Speech Therapy Frequency (ACUTE ONLY) min 2x/week Treatment Duration 2 weeks      CHL IP ORAL PHASE 08/07/2016 Oral Phase Impaired Oral - Pudding Teaspoon -- Oral - Pudding Cup -- Oral - Honey Teaspoon -- Oral - Honey Cup -- Oral - Nectar Teaspoon Weak lingual manipulation;Lingual pumping;Incomplete tongue to palate contact;Reduced posterior propulsion;Right pocketing in lateral sulci;Lingual/palatal residue;Delayed oral transit;Decreased bolus cohesion;Premature spillage Oral - Nectar Cup Weak lingual manipulation;Lingual pumping;Incomplete tongue to palate contact;Reduced posterior propulsion;Right pocketing in lateral sulci;Lingual/palatal residue;Delayed oral transit;Decreased bolus cohesion;Premature spillage;Right anterior bolus loss Oral - Nectar Straw Impaired mastication;Lingual pumping;Incomplete tongue to palate contact;Reduced posterior propulsion;Right pocketing in lateral sulci;Lingual/palatal residue;Delayed oral transit;Decreased bolus cohesion;Premature spillage Oral - Thin Teaspoon Lingual pumping;Incomplete tongue to palate contact;Reduced posterior propulsion;Right pocketing in lateral sulci;Lingual/palatal residue;Delayed oral transit;Decreased bolus cohesion;Premature spillage;Weak lingual manipulation Oral - Thin Cup Right anterior bolus loss;Impaired mastication;Weak lingual manipulation;Lingual pumping;Incomplete tongue to palate contact;Reduced posterior propulsion;Right pocketing in lateral sulci;Lingual/palatal residue;Delayed oral transit;Decreased bolus cohesion;Premature spillage Oral - Thin Straw Weak lingual  manipulation;Lingual pumping;Incomplete tongue to palate contact;Reduced posterior propulsion;Right pocketing in lateral sulci;Lingual/palatal residue;Delayed oral transit;Decreased bolus cohesion;Premature spillage Oral - Puree Weak lingual manipulation;Lingual pumping;Incomplete tongue to palate contact;Reduced posterior propulsion;Right pocketing in lateral sulci;Delayed oral transit;Decreased bolus cohesion;Premature spillage;Lingual/palatal residue Oral - Mech Soft -- Oral - Regular Impaired mastication;Weak lingual manipulation;Lingual pumping;Incomplete tongue to palate contact;Reduced posterior propulsion;Right pocketing in lateral sulci;Lingual/palatal residue;Delayed oral transit;Decreased bolus cohesion;Premature spillage Oral - Multi-Consistency -- Oral - Pill -- Oral Phase - Comment --  CHL IP PHARYNGEAL PHASE 08/07/2016 Pharyngeal Phase Impaired Pharyngeal- Pudding Teaspoon -- Pharyngeal -- Pharyngeal- Pudding Cup -- Pharyngeal -- Pharyngeal- Honey Teaspoon -- Pharyngeal -- Pharyngeal- Honey Cup -- Pharyngeal -- Pharyngeal- Nectar Teaspoon Delayed swallow initiation-pyriform sinuses;Reduced epiglottic inversion;Reduced tongue base retraction;Pharyngeal residue - valleculae;Pharyngeal residue - pyriform Pharyngeal -- Pharyngeal- Nectar Cup Delayed swallow initiation-pyriform sinuses;Reduced epiglottic inversion;Reduced tongue base retraction;Pharyngeal residue - valleculae;Pharyngeal residue - pyriform;Penetration/Aspiration before swallow;Significant aspiration (Amount);Compensatory strategies attempted (with notebox) Pharyngeal Material enters airway, passes BELOW cords and not ejected out despite cough attempt by patient Pharyngeal- Nectar Straw Delayed swallow initiation-pyriform sinuses;Reduced epiglottic inversion;Reduced tongue base retraction;Pharyngeal residue - valleculae;Pharyngeal residue - pyriform;Penetration/Aspiration before swallow;Significant aspiration (Amount);Compensatory strategies  attempted (with notebox);Penetration/Aspiration during swallow;Trace aspiration Pharyngeal Material enters airway, passes BELOW cords without attempt by  patient to eject out (silent aspiration);Material enters airway, CONTACTS cords and not ejected out;Material enters airway, remains ABOVE vocal cords and not ejected out Pharyngeal- Thin Teaspoon Delayed swallow initiation-pyriform sinuses;Reduced epiglottic inversion;Reduced tongue base retraction;Pharyngeal residue - valleculae;Pharyngeal residue - pyriform;Penetration/Aspiration before swallow;Compensatory strategies attempted (with notebox);Penetration/Aspiration during swallow;Trace aspiration;Reduced airway/laryngeal closure;Inter-arytenoid space residue Pharyngeal Material enters airway, passes BELOW cords without attempt by patient to eject out (silent aspiration);Material enters airway, CONTACTS cords and not ejected out Pharyngeal- Thin Cup Delayed swallow initiation-pyriform sinuses;Reduced epiglottic inversion;Reduced tongue base retraction;Pharyngeal residue - valleculae;Pharyngeal residue - pyriform;Penetration/Aspiration before swallow;Compensatory strategies attempted (with notebox);Penetration/Aspiration during swallow;Trace aspiration;Reduced airway/laryngeal closure;Inter-arytenoid space residue Pharyngeal Material enters airway, passes BELOW cords without attempt by patient to eject out (silent aspiration);Material enters airway, CONTACTS cords and not ejected out Pharyngeal- Thin Straw Delayed swallow initiation-pyriform sinuses;Reduced epiglottic inversion;Reduced tongue base retraction;Pharyngeal residue - valleculae;Pharyngeal residue - pyriform;Penetration/Aspiration before swallow;Compensatory strategies attempted (with notebox);Penetration/Aspiration during swallow;Trace aspiration;Reduced airway/laryngeal closure;Inter-arytenoid space residue Pharyngeal -- Pharyngeal- Puree Delayed swallow initiation-pyriform sinuses;Reduced epiglottic  inversion;Reduced tongue base retraction;Pharyngeal residue - valleculae;Pharyngeal residue - pyriform Pharyngeal -- Pharyngeal- Mechanical Soft -- Pharyngeal -- Pharyngeal- Regular Delayed swallow initiation-pyriform sinuses;Reduced epiglottic inversion;Reduced tongue base retraction;Pharyngeal residue - valleculae;Pharyngeal residue - pyriform Pharyngeal -- Pharyngeal- Multi-consistency -- Pharyngeal -- Pharyngeal- Pill -- Pharyngeal -- Pharyngeal Comment --  No flowsheet data found. No flowsheet data found. Harlon Ditty, [place] CCC-SLP [phone] Claudine Mouton 08/07/2016, 11:58 AM               Scheduled Meds: . chlorhexidine  15 mL Mouth Rinse BID  . feeding supplement (GLUCERNA SHAKE)  237 mL Oral TID BM   Continuous Infusions:   LOS: 2 days    Time spent: > 35 minutes  Penny Pia, MD [facility] Pager [phone] If 7PM-7AM, please contact night-coverage [email or web address] Password [facility] 08/08/2016, 3:45 PM

## 2016-08-09 ENCOUNTER — Inpatient Hospital Stay (HOSPITAL_COMMUNITY): Payer: Medicare Other

## 2016-08-09 LAB — CBC
HEMATOCRIT: 31 % — AB (ref 39.0–52.0)
Hemoglobin: 9.5 g/dL — ABNORMAL LOW (ref 13.0–17.0)
MCH: 25.8 pg — AB (ref 26.0–34.0)
MCHC: 30.6 g/dL (ref 30.0–36.0)
MCV: 84.2 fL (ref 78.0–100.0)
Platelets: 298 10*3/uL (ref 150–400)
RBC: 3.68 MIL/uL — ABNORMAL LOW (ref 4.22–5.81)
RDW: 14.7 % (ref 11.5–15.5)
WBC: 12.7 10*3/uL — ABNORMAL HIGH (ref 4.0–10.5)

## 2016-08-09 LAB — BASIC METABOLIC PANEL
Anion gap: 8 (ref 5–15)
BUN: 22 mg/dL — AB (ref 6–20)
CHLORIDE: 106 mmol/L (ref 101–111)
CO2: 23 mmol/L (ref 22–32)
Calcium: 9 mg/dL (ref 8.9–10.3)
Creatinine, Ser: 1.13 mg/dL (ref 0.61–1.24)
GFR calc Af Amer: >60 mL/min (ref >60)
GFR calc non Af Amer: >60 mL/min (ref >60)
GLUCOSE: 142 mg/dL — AB (ref 65–99)
POTASSIUM: 4.4 mmol/L (ref 3.5–5.1)
Sodium: 137 mmol/L (ref 135–145)

## 2016-08-09 LAB — GLUCOSE, CAPILLARY
Glucose-Capillary: 122 mg/dL — ABNORMAL HIGH (ref 65–99)
Glucose-Capillary: 127 mg/dL — ABNORMAL HIGH (ref 65–99)
Glucose-Capillary: 122 mg/dL — ABNORMAL HIGH (ref 65–99)

## 2016-08-09 MED ORDER — LEVOFLOXACIN IN D5W 750 MG/150ML IV SOLN
750.0000 mg | INTRAVENOUS | Status: DC
  Administered 2016-08-09 – 2016-08-11 (×3): 750 mg via INTRAVENOUS
  Filled 2016-08-09 (×3): qty 150

## 2016-08-09 MED ORDER — INSULIN ASPART 100 UNIT/ML ~~LOC~~ SOLN: 0.0000 [IU] | Freq: Every day | SUBCUTANEOUS | Status: DC

## 2016-08-09 MED ORDER — KCL IN DEXTROSE-NACL 20-5-0.45 MEQ/L-%-% IV SOLN
INTRAVENOUS | Status: DC
  Administered 2016-08-09: 13:00:00 via INTRAVENOUS
  Administered 2016-08-10: 100 mL/h via INTRAVENOUS
  Administered 2016-08-10 – 2016-08-11 (×2): via INTRAVENOUS
  Filled 2016-08-09 (×5): qty 1000

## 2016-08-09 MED ORDER — INSULIN ASPART 100 UNIT/ML ~~LOC~~ SOLN
0.0000 [IU] | Freq: Three times a day (TID) | SUBCUTANEOUS | Status: DC
  Administered 2016-08-10: 1 [IU] via SUBCUTANEOUS
  Administered 2016-08-10: 2 [IU] via SUBCUTANEOUS
  Administered 2016-08-10: 1 [IU] via SUBCUTANEOUS
  Administered 2016-08-11 (×2): 2 [IU] via SUBCUTANEOUS

## 2016-08-09 NOTE — Progress Notes (Signed)
PROGRESS NOTE    Malik Bruce  ZOX:[id number] DOB: [birth year] DOA: 08/06/2016 PCP: No primary care provider on file.   Brief Narrative:  66 y.o. male with medical history significant of CVA with residual right-sided hemiparesis, DM type II, and HTN; who presented as a code stroke from [facility] nursing facility. He presented with new stroke.  Assessment & Plan:   Principal Problem:   CVA (cerebral vascular accident) [facility]) - Patient is still under going workup. Echo completed today. Neurology on board, I am waiting for final recommendations - Speech therapy eval and patient is on dysphagia 1 diet with nectar thick liquids. He had emesis this am. As such will make npo for now and place on MIVF's. May require reevaluation   Active Problems:   Acute metabolic encephalopathy - Secondary to principal problem    Leukocytosis - Pt vomited, chest x ray obtained and negative. Will cover with levaquin for now. - Make patient NPO - reassess cbc next am.  Hyperglycemia - Will place on sensitive slidding scale for now since I will place on MIVF which has dextrose (since patient is npo)    Anemia due to chronic kidney disease - Stable at 9    Hyperlipidemia - Unable to take oral medications secondary to principal problem    CVA, old, hemiparesis (HCC) - Complicating current clinical scenario    DVT prophylaxis: SCDs Code Status: Full Family Communication: Discussed with family member Disposition Plan: Pending further workup from specialist   Consultants:   Neurology   Procedures: Stroke work up   Antimicrobials: None   Subjective: Pt has inability to express himself well, resting in bed this am.  Objective: Vitals:   08/08/16 2105 08/09/16 0150 08/09/16 0600 08/09/16 0925  BP: 132/86 134/65 135/65 123/67  Pulse: (!) 107 88 91 88  Resp: Temp: 98.7 F (37.1 C) 97.8 F (36.6 C) 98 F (36.7 C) 97.6 F (36.4 C)  TempSrc: Oral Oral Oral Oral    SpO2: 100% 99% 100% 100%  Weight:      Height:        Intake/Output Summary (Last 24 hours) at 08/09/16 1252 Last data filed at 08/09/16 0700  Gross per 24 hour  Intake              320 ml  Output              600 ml  Net             -280 ml   Filed Weights   08/06/16 0530  Weight: 74.6 kg (164 lb 7.4 oz)    Examination:  General exam: Appears calm and comfortable, in nad. Respiratory system: Clear to auscultation. Respiratory effort normal. Cardiovascular system: S1 & S2 heard, RRR. No JVD, murmurs Gastrointestinal system: Abdomen is nondistended, soft and nontender. No organomegaly or masses felt. Normal bowel sounds heard. Central nervous system: dysarthria, facial droop. Awake and alert Extremities: Right sided weakness, warm extremities Skin: No rashes, lesions or ulcers, on limited exam. Psychiatry: Unable to assess secondary to dysarthria  Data Reviewed: I have personally reviewed following labs and imaging studies  CBC:  Recent Labs Lab 08/05/16 2346 08/05/16 2356 08/07/16 0305 08/09/16 1016  WBC 15.5*  --  10.3 12.7*  NEUTROABS 11.9*  --  7.9*  --   HGB 9.2* 9.5* 9.2* 9.5*  HCT 29.3* 28.0* 30.0* 31.0*  MCV 84.7  --  83.8 84.2  PLT 300  --  312  298   Basic Metabolic Panel:  Recent Labs Lab 08/05/16 2346 08/05/16 2356 08/07/16 0305 08/09/16 1016  NA 137 140 137 137  K 4.4 4.4 4.4 4.4  CL 106 106 105 106  CO2 23  --  23 23  GLUCOSE 115* 114* 97 142*  BUN 26* 28* 21* 22*  CREATININE 1.40* 1.40* 1.19 1.13  CALCIUM 9.2  --  9.1 9.0   GFR: Estimated Creatinine Clearance: 66.9 mL/min (by C-G formula based on SCr of 1.13 mg/dL). Liver Function Tests:  Recent Labs Lab 08/05/16 2346  AST 19  ALT 13*  ALKPHOS 57  BILITOT 0.5  PROT 7.5  ALBUMIN 3.4*   No results for input(s): LIPASE, AMYLASE in the last 168 hours. No results for input(s): AMMONIA in the last 168 hours. Coagulation Profile:  Recent Labs Lab 08/05/16 2346  INR 1.14    Cardiac Enzymes: No results for input(s): CKTOTAL, CKMB, CKMBINDEX, TROPONINI in the last 168 hours. BNP (last 3 results) No results for input(s): PROBNP in the last 8760 hours. HbA1C:  Recent Labs  08/07/16 0304  HGBA1C 5.3   CBG:  Recent Labs Lab 08/05/16 2349 08/07/16 0836 08/09/16 1046  GLUCAP 107* 95 122*   Lipid Profile:  Recent Labs  08/07/16 0305  CHOL 93  HDL 19*  LDLCALC 40  TRIG 409*  CHOLHDL 4.9   Thyroid Function Tests: No results for input(s): TSH, T4TOTAL, FREET4, T3FREE, THYROIDAB in the last 72 hours. Anemia Panel: No results for input(s): VITAMINB12, FOLATE, FERRITIN, TIBC, IRON, RETICCTPCT in the last 72 hours. Sepsis Labs:  Recent Labs Lab 08/06/16 0350  LATICACIDVEN 1.48    Recent Results (from the past 240 hour(s))  MRSA PCR Screening     Status: None   Collection Time: 08/06/16  9:48 AM  Result Value Ref Range Status   MRSA by PCR NEGATIVE NEGATIVE Final    Comment:        The GeneXpert MRSA Assay (FDA approved for NASAL specimens only), is one component of a comprehensive MRSA colonization surveillance program. It is not intended to diagnose MRSA infection nor to guide or monitor treatment for MRSA infections.          Radiology Studies: Dg Chest Port 1 View  Result Date: 08/09/2016 CLINICAL DATA:  Cough with vomiting EXAM: PORTABLE CHEST 1 VIEW COMPARISON:  August 06, 2016 FINDINGS: There is no edema or consolidation. Heart size and pulmonary vascularity are normal. No adenopathy. No bone lesions. There is contrast in the colon. IMPRESSION: No edema or consolidation. Electronically Signed   By: Bretta Bang III M.D.   On: 08/09/2016 09:07   Dg Abd Portable 1v  Result Date: 08/09/2016 CLINICAL DATA:  Vomiting episodes this morning. Possible aspiration. EXAM: PORTABLE ABDOMEN - 1 VIEW COMPARISON:  Swallowing study 08/07/2016 FINDINGS: Single view of the abdomen was obtained. There is oral contrast in the abdomen  and pelvis, predominantly in the colon. This contrast is from the recent swallowing examination. Nonobstructive bowel gas pattern. Prominent vascular calcifications in the pelvis. Limited evaluation for abdominal calcifications due to the barium within the abdomen and pelvis. IMPRESSION: Normal bowel gas pattern. Electronically Signed   By: Richarda Overlie M.D.   On: 08/09/2016 10:18    Scheduled Meds: . chlorhexidine  15 mL Mouth Rinse BID  . feeding supplement (GLUCERNA SHAKE)  237 mL Oral TID BM   Continuous Infusions:   LOS: 3 days    Time spent: > 35 minutes  Penny Pia, MD  [facility] Pager [phone] If 7PM-7AM, please contact night-coverage [email or web address] Password [facility] 08/09/2016, 12:52 PM

## 2016-08-09 NOTE — Progress Notes (Addendum)
Patient vomited green bile and is now coughing,immediately used oral suction to protect airway. MD notified.

## 2016-08-10 LAB — CBC
HCT: 29.2 % — ABNORMAL LOW (ref 39.0–52.0)
Hemoglobin: 9.4 g/dL — ABNORMAL LOW (ref 13.0–17.0)
MCH: 26.9 pg (ref 26.0–34.0)
MCHC: 32.2 g/dL (ref 30.0–36.0)
MCV: 83.7 fL (ref 78.0–100.0)
PLATELETS: 273 10*3/uL (ref 150–400)
RBC: 3.49 MIL/uL — ABNORMAL LOW (ref 4.22–5.81)
RDW: 15.2 % (ref 11.5–15.5)
WBC: 10.2 10*3/uL (ref 4.0–10.5)

## 2016-08-10 LAB — GLUCOSE, CAPILLARY
GLUCOSE-CAPILLARY: 145 mg/dL — AB (ref 65–99)
Glucose-Capillary: 125 mg/dL — ABNORMAL HIGH (ref 65–99)
Glucose-Capillary: 130 mg/dL — ABNORMAL HIGH (ref 65–99)
Glucose-Capillary: 139 mg/dL — ABNORMAL HIGH (ref 65–99)
Glucose-Capillary: 156 mg/dL — ABNORMAL HIGH (ref 65–99)
Glucose-Capillary: 175 mg/dL — ABNORMAL HIGH (ref 65–99)

## 2016-08-10 MED ORDER — RESOURCE THICKENUP CLEAR PO POWD
ORAL | Status: DC | PRN
  Filled 2016-08-10: qty 125

## 2016-08-10 NOTE — Progress Notes (Signed)
  Speech Language Pathology Treatment: Dysphagia  Patient Details Name: Malik Bruce MRN: [id number] DOB: [birth year] Today's Date: 08/10/2016 Time: 2952-8413 SLP Time Calculation (min) (ACUTE ONLY): 14 min  Assessment / Plan / Recommendation Clinical Impression  Pt seen after remaining NPo for 24 hours after vomiting bile. No change in swallow function, able tolerate teaspoon sips of nectar thick liquids and bites of puree without signs of aspiration. Recommend pt resume prior diet. Discussed with NT and RN.   HPI        SLP Plan  Continue with current plan of care       Recommendations  Diet recommendations: Dysphagia 1 (puree);Nectar-thick liquid Liquids provided via: Teaspoon Medication Administration: Crushed with puree Supervision: Full supervision/cueing for compensatory strategies;Trained caregiver to feed patient Compensations: Slow rate;Small sips/bites;Monitor for anterior loss;Multiple dry swallows after each bite/sip Postural Changes and/or Swallow Maneuvers: Seated upright 90 degrees                Plan: Continue with current plan of care       GO                Cyann Venti, Riley Nearing 08/10/2016, 3:07 PM

## 2016-08-10 NOTE — Progress Notes (Signed)
PROGRESS NOTE    Malik Bruce  ZOX:[id number] DOB: [birth year] DOA: 08/06/2016 PCP: No primary care provider on file.   Brief Narrative:  66 y.o. male with medical history significant of CVA with residual right-sided hemiparesis, DM type II, and HTN; who presented as a code stroke from [facility] nursing facility. He presented with new stroke.  Assessment & Plan:   Principal Problem:   CVA (cerebral vascular accident) [facility]) - Patient is still under going workup. Echo completed EF 55-60% - No noted final recommendations from neurology, will page team  Active Problems:   Acute metabolic encephalopathy - Secondary to principal problem    Leukocytosis - Resolved  Hyperglycemia - Place back on SSI    Anemia due to chronic kidney disease - Stable at 9    Hyperlipidemia - Unable to take oral medications secondary to principal problem    CVA, old, hemiparesis (HCC) - Complicating current clinical scenario    DVT prophylaxis: SCDs Code Status: Full Family Communication: Discussed with family member Disposition Plan: Pending further workup from Neurologist   Consultants:   Neurology   Procedures: Stroke work up   Antimicrobials: None   Subjective: Pt responding better to questions. More alert today  Objective: Vitals:   08/09/16 2015 08/10/16 0451 08/10/16 0935 08/10/16 1417  BP: (!) 109/56 130/61 126/61 129/63  Pulse: 93 96 97 91  Resp: Temp: 98.7 F (37.1 C) 97.4 F (36.3 C) 98.1 F (36.7 C) 98 F (36.7 C)  TempSrc: Oral Oral Oral Oral  SpO2: 99% 96% 100% 99%  Weight:      Height:        Intake/Output Summary (Last 24 hours) at 08/10/16 1633 Last data filed at 08/10/16 0800  Gross per 24 hour  Intake          1503.33 ml  Output              375 ml  Net          1128.33 ml   Filed Weights   08/06/16 0530  Weight: 74.6 kg (164 lb 7.4 oz)    Examination:  General exam: Appears calm and comfortable, in nad. Respiratory  system: Clear to auscultation. Respiratory effort normal. Cardiovascular system: S1 & S2 heard, RRR. No JVD, murmurs Gastrointestinal system: Abdomen is nondistended, soft and nontender. No organomegaly or masses felt. Normal bowel sounds heard. Central nervous system: dysarthria, facial droop. Awake and alert Extremities: Right sided weakness, warm extremities Skin: No rashes, lesions or ulcers, on limited exam. Psychiatry: Unable to assess secondary to dysarthria  Data Reviewed: I have personally reviewed following labs and imaging studies  CBC:  Recent Labs Lab 08/05/16 2346 08/05/16 2356 08/07/16 0305 08/09/16 1016 08/10/16 0031  WBC 15.5*  --  10.3 12.7* 10.2  NEUTROABS 11.9*  --  7.9*  --   --   HGB 9.2* 9.5* 9.2* 9.5* 9.4*  HCT 29.3* 28.0* 30.0* 31.0* 29.2*  MCV 84.7  --  83.8 84.2 83.7  PLT 300  --  312 298 273   Basic Metabolic Panel:  Recent Labs Lab 08/05/16 2346 08/05/16 2356 08/07/16 0305 08/09/16 1016  NA 137 140 137 137  K 4.4 4.4 4.4 4.4  CL 106 106 105 106  CO2 23  --  23 23  GLUCOSE 115* 114* 97 142*  BUN 26* 28* 21* 22*  CREATININE 1.40* 1.40* 1.19 1.13  CALCIUM 9.2  --  9.1 9.0   GFR: Estimated  Creatinine Clearance: 66.9 mL/min (by C-G formula based on SCr of 1.13 mg/dL). Liver Function Tests:  Recent Labs Lab 08/05/16 2346  AST 19  ALT 13*  ALKPHOS 57  BILITOT 0.5  PROT 7.5  ALBUMIN 3.4*   No results for input(s): LIPASE, AMYLASE in the last 168 hours. No results for input(s): AMMONIA in the last 168 hours. Coagulation Profile:  Recent Labs Lab 08/05/16 2346  INR 1.14   Cardiac Enzymes: No results for input(s): CKTOTAL, CKMB, CKMBINDEX, TROPONINI in the last 168 hours. BNP (last 3 results) No results for input(s): PROBNP in the last 8760 hours. HbA1C: No results for input(s): HGBA1C in the last 72 hours. CBG:  Recent Labs Lab 08/09/16 2000 08/10/16 0028 08/10/16 0417 08/10/16 0858 08/10/16 1127  GLUCAP 122* 139*  156* 145* 125*   Lipid Profile: No results for input(s): CHOL, HDL, LDLCALC, TRIG, CHOLHDL, LDLDIRECT in the last 72 hours. Thyroid Function Tests: No results for input(s): TSH, T4TOTAL, FREET4, T3FREE, THYROIDAB in the last 72 hours. Anemia Panel: No results for input(s): VITAMINB12, FOLATE, FERRITIN, TIBC, IRON, RETICCTPCT in the last 72 hours. Sepsis Labs:  Recent Labs Lab 08/06/16 0350  LATICACIDVEN 1.48    Recent Results (from the past 240 hour(s))  MRSA PCR Screening     Status: None   Collection Time: 08/06/16  9:48 AM  Result Value Ref Range Status   MRSA by PCR NEGATIVE NEGATIVE Final    Comment:        The GeneXpert MRSA Assay (FDA approved for NASAL specimens only), is one component of a comprehensive MRSA colonization surveillance program. It is not intended to diagnose MRSA infection nor to guide or monitor treatment for MRSA infections.          Radiology Studies: Dg Chest Port 1 View  Result Date: 08/09/2016 CLINICAL DATA:  Cough with vomiting EXAM: PORTABLE CHEST 1 VIEW COMPARISON:  August 06, 2016 FINDINGS: There is no edema or consolidation. Heart size and pulmonary vascularity are normal. No adenopathy. No bone lesions. There is contrast in the colon. IMPRESSION: No edema or consolidation. Electronically Signed   By: Bretta Bang III M.D.   On: 08/09/2016 09:07   Dg Abd Portable 1v  Result Date: 08/09/2016 CLINICAL DATA:  Vomiting episodes this morning. Possible aspiration. EXAM: PORTABLE ABDOMEN - 1 VIEW COMPARISON:  Swallowing study 08/07/2016 FINDINGS: Single view of the abdomen was obtained. There is oral contrast in the abdomen and pelvis, predominantly in the colon. This contrast is from the recent swallowing examination. Nonobstructive bowel gas pattern. Prominent vascular calcifications in the pelvis. Limited evaluation for abdominal calcifications due to the barium within the abdomen and pelvis. IMPRESSION: Normal bowel gas pattern.  Electronically Signed   By: Richarda Overlie M.D.   On: 08/09/2016 10:18    Scheduled Meds: . chlorhexidine  15 mL Mouth Rinse BID  . feeding supplement (GLUCERNA SHAKE)  237 mL Oral TID BM  . insulin aspart  0-5 Units Subcutaneous QHS  . insulin aspart  0-9 Units Subcutaneous TID WC   Continuous Infusions: . dextrose 5 % and 0.45 % NaCl with KCl 20 mEq/L 100 mL/hr at 08/10/16 0018  . levofloxacin (LEVAQUIN) IV 750 mg (08/10/16 1525)     LOS: 4 days    Time spent: > 35 minutes  Penny Pia, MD [facility] Pager [phone] If 7PM-7AM, please contact night-coverage [email or web address] [facility] 08/10/2016, 4:33 PM

## 2016-08-10 NOTE — Care Management Note (Signed)
Case Management Note  Patient Details  Name: Malik Bruce MRN: [id number] Date of Birth: 06-09-1950  Subjective/Objective:                    Action/Plan: Plan is for SNF when medically ready. CM following for further d/c needs.   Expected Discharge Date:                  Expected Discharge Plan:  Skilled Nursing Facility  In-House Referral:  Clinical Social Work  Discharge planning Services  CM Consult  Post Acute Care Choice:    Choice offered to:     DME Arranged:    DME Agency:     HH Arranged:    HH Agency:     Status of Service:  In process, will continue to follow  If discussed at Long Length of Stay Meetings, dates discussed:    Additional Comments:  Kermit Balo, RN 08/10/2016, 11:17 AM

## 2016-08-10 NOTE — Clinical Social Work Placement (Addendum)
   CLINICAL SOCIAL WORK PLACEMENT  NOTE  Date:  08/10/2016  Patient Details  Name: Malik Bruce MRN: [id number] Date of Birth: [birth year]  Clinical Social Work is seeking post-discharge placement for this patient at the Skilled  Nursing Facility level of care (*CSW will initial, date and re-position this form in  chart as items are completed):      Patient/family provided with [facility] Clinical Social Work Department's list of facilities offering this level of care within the geographic area requested by the patient (or if unable, by the patient's family).  Yes   Patient/family informed of their freedom to choose among providers that offer the needed level of care, that participate in Medicare, Medicaid or managed care program needed by the patient, have an available bed and are willing to accept the patient.      Patient/family informed of [facility]'s ownership interest in [facility] and [facility], as well as of the fact that they are under no obligation to receive care at these facilities.  PASRR submitted to EDS on       PASRR number received on 08/10/16     Existing PASRR number confirmed on       FL2 transmitted to all facilities in geographic area requested by pt/family on 08/10/16     FL2 transmitted to all facilities within larger geographic area on       Patient informed that his/her managed care company has contracts with or will negotiate with certain facilities, including the following:        Yes   Patient/family informed of bed offers received.  Patient chooses bed at [facility]     Physician recommends and patient chooses bed at      Patient to be transferred to [facility] on 08/11/16.  Patient to be transferred to facility by [facility]     Patient family notified on 08/11/16 of transfer.  Name of family member notified:     Aurther Loft  PHYSICIAN Please prepare priority discharge summary, including medications, Please prepare  prescriptions, Please sign FL2     Additional Comment:    _______________________________________________ Maree Krabbe, LCSW 08/10/2016, 3:14 PM

## 2016-08-10 NOTE — Progress Notes (Signed)
Pt's brother requested to speak to MD regarding pt's plan of care. RN page notified MD.

## 2016-08-10 NOTE — Care Management Important Message (Signed)
Important Message  Patient Details  Name: Malik Bruce MRN: [id number] Date of Birth: [birth year]   Medicare Important Message Given:  Yes    Junella Domke Stefan Church 08/10/2016, 11:25 AM

## 2016-08-10 NOTE — Progress Notes (Signed)
Physical Therapy Treatment Patient Details Name: Malik Bruce MRN: [id number] DOB: 05/07/1950 Today's Date: 08/10/2016    History of Present Illness Patient is a 66 yo male admitted 08/06/16 from SNF with decreased speech.  Patient with new acute infarct in Rt [place].    PMH:  Lt MCA CVA with Rt hemiparesis, DM, HTN    PT Comments    Progressing slowly, but participated well in sitting and standing activities.  Mostly working on w/shifting and balance with scooting in sitting or pivoting in standing.  Pt able to follow simple commands if repeated and given time to respond.   Follow Up Recommendations  SNF     Equipment Recommendations  None recommended by PT    Recommendations for Other Services       Precautions / Restrictions Precautions Precautions: Fall    Mobility  Bed Mobility Overal bed mobility: Needs Assistance Bed Mobility: Supine to Sit     Supine to sit: Mod assist;+2 for physical assistance     General bed mobility comments: up via L elbow. facilitating trunk to come forward and then working on  pushing with and pivoting on L UE to scoot forward.  Transfers Overall transfer level: Needs assistance Equipment used: Rolling walker (2 wheeled) (used as a Comptroller) Transfers: Sit to/from [employer] to Stand: Mod assist;+2 physical assistance Stand pivot transfers: Max assist;+2 physical assistance       General transfer comment: cues for hand placement, truncal assist to come forward and up.  Initial support at R LE in knee ext and hip ER..  Pivoted using one handle of the RW and assisting w/shift and movement of r LE.  2nd person assisting with w/shift and truncal support  Ambulation/Gait                 Stairs            Wheelchair Mobility    Modified Rankin (Stroke Patients Only) Modified Rankin (Stroke Patients Only) Pre-Morbid Rankin Score: Moderately severe disability Modified Rankin: Moderately severe  disability     Balance Overall balance assessment: Needs assistance   Sitting balance-Leahy Scale: Fair     Standing balance support: Single extremity supported Standing balance-Leahy Scale: Poor Standing balance comment: standing over 4 min working on upright stance and w/shift to prep for pivot to chair                            Cognition Arousal/Alertness: Awake/alert Behavior During Therapy: Flat affect Overall Cognitive Status: Difficult to assess                                 General Comments: pt needed repetitive cues and extra time      Exercises      General Comments        Pertinent Vitals/Pain Pain Assessment: Faces Faces Pain Scale: Hurts little more Pain Location: general, during LE MMT Pain Descriptors / Indicators: Grimacing;Discomfort Pain Intervention(s): Monitored during session    Home Living                      Prior Function            PT Goals (current goals can now be found in the care plan section) Acute Rehab PT Goals Patient Stated Goal: Unable to state PT Goal Formulation: Patient unable to  participate in goal setting Time For Goal Achievement: 08/14/16 Potential to Achieve Goals: Fair Progress towards PT goals: Progressing toward goals    Frequency    Min 2X/week      PT Plan Current plan remains appropriate    Co-evaluation              AM-PAC PT "6 Clicks" Daily Activity  Outcome Measure  Difficulty turning over in bed (including adjusting bedclothes, sheets and blankets)?: Total Difficulty moving from lying on back to sitting on the side of the bed? : Total Difficulty sitting down on and standing up from a chair with arms (e.g., wheelchair, bedside commode, etc,.)?: Total Help needed moving to and from a bed to chair (including a wheelchair)?: A Lot Help needed walking in hospital room?: A Lot Help needed climbing 3-5 steps with a railing? : Total 6 Click Score: 8     End of Session   Activity Tolerance: Patient tolerated treatment well Patient left: in chair;with call bell/phone within reach;with chair alarm set Nurse Communication: Mobility status PT Visit Diagnosis: Hemiplegia and hemiparesis;Other abnormalities of gait and mobility (R26.89) Hemiplegia - Right/Left: Right Hemiplegia - dominant/non-dominant: Dominant Hemiplegia - caused by: Cerebral infarction     Time: 1030-1105 PT Time Calculation (min) (ACUTE ONLY): 35 min  Charges:  $Therapeutic Activity: 23-37 mins                    G Codes:       08/12/16  Fordoche Bing, PT [phone]  (pager)   Malik Bruce Aug 12, 2016, 12:34 PM

## 2016-08-11 DIAGNOSIS — I6300 Cerebral infarction due to thrombosis of unspecified precerebral artery: Secondary | ICD-10-CM

## 2016-08-11 LAB — GLUCOSE, CAPILLARY
GLUCOSE-CAPILLARY: 151 mg/dL — AB (ref 65–99)
GLUCOSE-CAPILLARY: 113 mg/dL — AB (ref 65–99)
Glucose-Capillary: 164 mg/dL — ABNORMAL HIGH (ref 65–99)

## 2016-08-11 MED ORDER — CLOPIDOGREL BISULFATE 75 MG PO TABS
75.0000 mg | ORAL_TABLET | Freq: Every day | ORAL | Status: DC
  Administered 2016-08-11: 75 mg via ORAL
  Filled 2016-08-11: qty 1

## 2016-08-11 MED ORDER — CLOPIDOGREL BISULFATE 75 MG PO TABS: 75.0000 mg | ORAL_TABLET | Freq: Every day | ORAL | 0 refills | Status: AC

## 2016-08-11 MED ORDER — ATORVASTATIN CALCIUM 40 MG PO TABS
40.0000 mg | ORAL_TABLET | Freq: Every day | ORAL | Status: DC
  Administered 2016-08-11: 40 mg via ORAL
  Filled 2016-08-11: qty 1

## 2016-08-11 MED ORDER — INSULIN DETEMIR 100 UNIT/ML ~~LOC~~ SOLN: 7.0000 [IU] | Freq: Every day | SUBCUTANEOUS | 11 refills | Status: DC

## 2016-08-11 NOTE — Progress Notes (Signed)
Report given to Kim at [facility] at [employer]. Patient aware of transfer

## 2016-08-11 NOTE — Plan of Care (Signed)
Problem: Education: Goal: Knowledge of [facility] Education information/materials will improve Outcome: Progressing POC reviewed with pt.   

## 2016-08-11 NOTE — Discharge Instructions (Signed)
Ischemic Stroke An ischemic stroke is the sudden death of brain tissue. Blood carries oxygen to all areas of the body. This type of stroke happens when your blood does not flow to your brain like normal. Your brain cannot get the oxygen it needs. This is an emergency. It must be treated right away. Symptoms of a stroke usually happen all of a sudden. You may notice them when you wake up. They can include:  Weakness or loss of feeling in your face, arm, or leg. This often happens on one side of the body.  Trouble walking.  Trouble moving your arms or legs.  Loss of balance or coordination.  Feeling confused.  Trouble talking or understanding what people are saying.  Slurred speech.  Trouble seeing.  Seeing two of one object (double vision).  Feeling dizzy.  Feeling sick to your stomach (nauseous) and throwing up (vomiting).  A very bad headache for no reason. Get help as soon as any of these problems start. This is important. Some treatments work better if they are given right away. These include:  Aspirin.  Medicines to control blood pressure.  A shot (injection) of medicine to break up the blood clot.  Treatments given in the blood vessel (artery) to take out the clot or break it up. Other treatments may include:  Oxygen.  Fluids given through an IV tube.  Medicines to thin out your blood.  Procedures to help your blood flow better. What increases the risk? Certain things may make you more likely to have a stroke. Some of these are things that you can change, such as:  Being very overweight (obesity).  Smoking.  Taking birth control pills.  Not being active.  Drinking too much alcohol.  Using drugs. Other risk factors include:  High blood pressure.  High cholesterol.  Diabetes.  Heart disease.  Being African American, Native American, Hispanic, or [place] Native.  Being over age 60.  Family history of stroke.  Having had blood clots, stroke,  or warning stroke (transient ischemic attack, TIA) in the past.  Sickle cell disease.  Being a woman with a history of high blood pressure in pregnancy (preeclampsia).  Migraine headache.  Sleep apnea.  Having an irregular heartbeat (atrial fibrillation).  Long-term (chronic) diseases that cause soreness and swelling (inflammation).  Disorders that affect how your blood clots. Follow these instructions at home: Medicines  Take over-the-counter and prescription medicines only as told by your doctor.  If you were told to take aspirin or another medicine to thin your blood, take it exactly as told by your doctor.  Taking too much of the medicine can cause bleeding.  If you do not take enough, it may not work as well.  Know the side effects of your medicines. If you are taking a blood thinner, make sure you:  Hold pressure over any cuts for longer than usual.  Tell your dentist and other doctors that you take this medicine.  Avoid activities that may cause damage or injury to your body. Eating and drinking  Follow instructions from your doctor about what you cannot eat or drink.  Eat healthy foods.  If you have trouble with swallowing, do these things to avoid choking:  Take small bites when eating.  Eat foods that are soft or pureed. Safety  Follow instructions from your health care team about physical activity.  Use a walker or cane as told by your doctor.  Keep your home safe so you do not fall. This   may include:  Having experts look at your home to make sure it is safe.  Putting grab bars in the bedroom and bathroom.  Using raised toilets.  Putting a seat in the shower. General instructions  Do not use any tobacco products.  Examples of these are cigarettes, chewing tobacco, and e-cigarettes.  If you need help quitting, ask your doctor.  Limit how much alcohol you drink. This means no more than 1 drink a day for nonpregnant women and 2 drinks a day  for men. One drink equals 12 oz of beer, 5 oz of wine, or 1 oz of hard liquor.  If you need help to stop using drugs or alcohol, ask your doctor to refer you to a program or specialist.  Stay active. Exercise as told by your doctor.  Keep all follow-up visits as told by your doctor. This is important. Get help right away if:  You suddenly:  Have weakness or loss of feeling in your face, arm, or leg.  Feel confused.  Have trouble talking or understanding what people are saying.  Have trouble seeing.  Have trouble walking.  Have trouble moving your arms or legs.  Feel dizzy.  Lose your balance or coordination.  Have a very bad headache and you do not know why.  You pass out (lose consciousness) or almost pass out.  You have jerky movements that you cannot control (seizure). These symptoms may be an emergency. Do not wait to see if the symptoms will go away. Get medical help right away. Call your local emergency services (911 in the [place] Do not drive yourself to the hospital.  This information is not intended to replace advice given to you by your health care provider. Make sure you discuss any questions you have with your health care provider. Document Released: 03/19/2011 Document Revised: 09/10/2015 Document Reviewed: 06/26/2015 Elsevier Interactive Patient Education  2017 Elsevier Inc.  

## 2016-08-11 NOTE — Care Management Note (Signed)
Case Management Note  Patient Details  Name: Malik Bruce MRN: [id number] Date of Birth: November 07, 1950  Subjective/Objective:                    Action/Plan: Pt discharging to [facility] today. No further needs per CM.   Expected Discharge Date:  08/11/16               Expected Discharge Plan:  Skilled Nursing Facility  In-House Referral:  Clinical Social Work  Discharge planning Services  CM Consult  Post Acute Care Choice:    Choice offered to:     DME Arranged:    DME Agency:     HH Arranged:    HH Agency:     Status of Service:  Completed, signed off  If discussed at [employer], dates discussed:    Additional Comments:  Kermit Balo, RN 08/11/2016, 2:37 PM

## 2016-08-11 NOTE — Progress Notes (Signed)
Patient stood and pivoted to chair with 2 staff assist. Chair alarm on, will continue to monitor.

## 2016-08-11 NOTE — Discharge Summary (Signed)
Physician Discharge Summary  Malik Bruce:[id number] DOB: [birth year] DOA: 08/06/2016  PCP: No primary care provider on file.  Admit date: 08/06/2016 Discharge date: 08/11/2016  Time spent: > 35 minutes  Recommendations for Outpatient Follow-up:  1. Ensure f/u with neurologist 2. Decide when to continue BP medication 3. Monitor blood sugars closely and adjust hypoglycemic agents accordingly   Discharge Diagnoses:  Principal Problem:   CVA (cerebral vascular accident) (HCC) Active Problems:   Acute metabolic encephalopathy   Leukocytosis   Anemia due to chronic kidney disease   Renal insufficiency   Hyperlipidemia   CVA, old, hemiparesis (HCC)   Discharge Condition: stable  Diet recommendation: dysphagia I diet  Filed Weights   08/06/16 0530  Weight: 74.6 kg (164 lb 7.4 oz)    History of present illness:  66 y.o. male with medical history significant of CVA with residual right-sided hemiparesis, DM type II, and HTN; who presented as a code stroke from [facility] nursing facility. History is obtained mostly from the airport and talks with the patient's brother is present at bedside. The patient has not been able to communicate at this time. Patient had been reported to have been found drooling yesterday around 5 PM.  Diagnosed with acute stroke. Neurology followed.   Hospital Course:  Principal Problem:   CVA (cerebral vascular accident) [facility]) - Discussed with neurology who recommended placing on plavix and continuing statin. Echo completed EF 55-60% - d/c to SNF for physical therapy  Active Problems:   Acute metabolic encephalopathy - Secondary to principal problem, resolved    Leukocytosis - Resolved  Hyperglycemia - continue home medication regimen.    Anemia due to chronic kidney disease - Stable at 9    Hyperlipidemia - stable continue statin   Procedures:  Stroke w/u  Consultations:  Neurology  Discharge Exam: Vitals:   08/11/16  0908 08/11/16 1354  BP: (!) 111/54 (!) 122/54  Pulse: (!) 103 99  Resp: 20 20  Temp: 98.5 F (36.9 C) 98.8 F (37.1 C)    General: Pt in nad, alert and awake Cardiovascular: rrr, no rubs Respiratory: no increased wob, no wheezes  Discharge Instructions   Discharge Instructions    Call MD for:  severe uncontrolled pain    Complete by:  As directed    Call MD for:  temperature >100.4    Complete by:  As directed    Diet - low sodium heart healthy    Complete by:  As directed    Discharge instructions    Complete by:  As directed    Please have your primary care physician decide when to continue blood pressure medications if warranted. Ensure patient follows up with neurology   Increase activity slowly    Complete by:  As directed      Current Discharge Medication List    START taking these medications   Details  clopidogrel (PLAVIX) 75 MG tablet Take 1 tablet (75 mg total) by mouth daily. Qty: 30 tablet, Refills: 0      CONTINUE these medications which have CHANGED   Details  insulin detemir (LEVEMIR) 100 UNIT/ML injection Inject 0.07 mLs (7 Units total) into the skin daily. Qty: 10 mL, Refills: 11      CONTINUE these medications which have NOT CHANGED   Details  acetaminophen (TYLENOL) 500 MG tablet Take 500 mg by mouth See admin instructions. Take 500 mg by mouth in the morning. Take 500 mg by mouth every 4 hours as needed for  fever 99.5-101F, headache or minor discomfort.    alum & mag hydroxide-simeth (MINTOX) 200-200-20 MG/5ML suspension Take 30 mLs by mouth as needed for indigestion or heartburn (Do not exceed 4 doses in 24 hours).    atorvastatin (LIPITOR) 40 MG tablet Take 40 mg by mouth daily.    b complex vitamins tablet Take 1 tablet by mouth daily.    cyanocobalamin (,VITAMIN B-12,) 1000 MCG/ML injection Inject 1,000 mcg into the muscle every 30 (thirty) days. On the 11th of every month    furosemide (LASIX) 40 MG tablet Take 40 mg by mouth daily.     guaifenesin (ROBAFEN) 100 MG/5ML syrup Take 200 mg by mouth every 6 (six) hours as needed for cough (Do not exceed 4 doses in 24 hours).    metFORMIN (GLUCOPHAGE) 500 MG tablet Take 1,000 mg by mouth 2 (two) times daily with a meal.    pregabalin (LYRICA) 50 MG capsule Take 50 mg by mouth 3 (three) times daily.      STOP taking these medications     aspirin 325 MG tablet      insulin aspart (NOVOLOG FLEXPEN) 100 UNIT/ML FlexPen      lisinopril (PRINIVIL,ZESTRIL) 10 MG tablet      loperamide (IMODIUM) 2 MG capsule      magnesium hydroxide (MILK OF MAGNESIA) 400 MG/5ML suspension      Neomycin-Bacitracin-Polymyxin (TRIPLE ANTIBIOTIC) [phone] OINT      potassium chloride SA (K-DUR,KLOR-CON) 20 MEQ tablet      Skin Protectants, Misc. (EUCERIN) cream        No Known Allergies    The results of significant diagnostics from this hospitalization (including imaging, microbiology, ancillary and laboratory) are listed below for reference.    Significant Diagnostic Studies: Ct Angio Head W Or Wo Contrast  Result Date: 08/06/2016 CLINICAL DATA:  Left-sided weakness and dysphasia EXAM: CT ANGIOGRAPHY HEAD AND NECK CT PERFUSION BRAIN TECHNIQUE: Multidetector CT imaging of the head and neck was performed using the standard protocol during bolus administration of intravenous contrast. Multiplanar CT image reconstructions and MIPs were obtained to evaluate the vascular anatomy. Carotid stenosis measurements (when applicable) are obtained utilizing NASCET criteria, using the distal internal carotid diameter as the denominator. Multiphase CT imaging of the brain was performed following IV bolus contrast injection. Subsequent parametric perfusion maps were calculated using RAPID software. CONTRAST:  100 mL Isovue 370 IV COMPARISON:  None. FINDINGS: CTA NECK FINDINGS Aortic arch: There is no aneurysm or dissection of the visualized ascending aorta or aortic arch. There is a normal 3 vessel  branching pattern. The visualized proximal subclavian arteries are normal. Right carotid system: The right common carotid origin is widely patent. There is no common carotid or internal carotid artery dissection or aneurysm. There is mixed calcified and noncalcified plaque at the bifurcation but no hemodynamically significant stenosis. Left carotid system: There is a large calcification along the medial aspect of the left common carotid lumen there results in approximately 60% stenosis. There is no common carotid or internal carotid artery dissection or aneurysm. There is atherosclerotic calcification of the bifurcation but no hemodynamically significant stenosis. Vertebral arteries: The vertebral system is codominant. There is moderate narrowing of both vertebral artery origins secondary to atherosclerotic calcification. There is multifocal atherosclerosis within both V1 segments. The V2 and V3 segments are normal. There is minimal calcification in the left V4 segment. Skeleton: There is no bony spinal canal stenosis. No lytic or blastic lesions. Other neck: The nasopharynx is clear. The oropharynx  and hypopharynx are normal. The epiglottis is normal. The supraglottic larynx, glottis and subglottic larynx are normal. No retropharyngeal collection. The parapharyngeal spaces are preserved. The parotid and submandibular glands are normal. No sialolithiasis or salivary ductal dilatation. The thyroid gland is normal. There is no cervical lymphadenopathy. Upper chest: No pneumothorax or pleural effusion. No nodules or masses. Review of the MIP images confirms the above findings CTA HEAD FINDINGS Anterior circulation: --Intracranial internal carotid arteries: Mild atherosclerotic calcification without hemodynamically significant stenosis. --Anterior cerebral arteries: Normal. --Middle cerebral arteries: Normal. --Posterior communicating arteries: Present bilaterally Posterior circulation: --Posterior cerebral arteries:  Normal. --Superior cerebellar arteries: Normal. --Basilar artery: Normal. --Anterior inferior cerebellar arteries: Normal. --Posterior inferior cerebellar arteries: Normal. Venous sinuses: As permitted by contrast timing, patent. Anatomic variants: Fetal origin of the left vertebral artery. Other findings: There is a superior left convexity extra-axial contrast-enhancing mass that measures 13 x 10 mm. Review of the MIP images confirms the above findings CT Brain Perfusion Findings: CBF (<30%) Volume: 0mL Perfusion (Tmax>6.0s) volume: 0mL Mismatch Volume: 0mL Infarction Location:None IMPRESSION: 1. Normal perfusion scan of the brain. 2. No intracranial or cervical large vessel occlusion. 3. Right carotid bifurcation atherosclerosis without hemodynamically significant stenosis. Chunky calcification within the left common carotid artery, resulting in 60% stenosis. 4. Bilateral moderate narrowing of the vertebral artery origins due to atherosclerotic calcification. Otherwise normal vertebral arteries. 5. Extra-axial contrast-enhancing mass at the superior left convexity, likely meningioma. Electronically Signed   By: Deatra Robinson M.D.   On: 08/06/2016 00:42   Ct Angio Neck W Or Wo Contrast  Result Date: 08/06/2016 CLINICAL DATA:  Left-sided weakness and dysphasia EXAM: CT ANGIOGRAPHY HEAD AND NECK CT PERFUSION BRAIN TECHNIQUE: Multidetector CT imaging of the head and neck was performed using the standard protocol during bolus administration of intravenous contrast. Multiplanar CT image reconstructions and MIPs were obtained to evaluate the vascular anatomy. Carotid stenosis measurements (when applicable) are obtained utilizing NASCET criteria, using the distal internal carotid diameter as the denominator. Multiphase CT imaging of the brain was performed following IV bolus contrast injection. Subsequent parametric perfusion maps were calculated using RAPID software. CONTRAST:  100 mL Isovue 370 IV COMPARISON:  None.  FINDINGS: CTA NECK FINDINGS Aortic arch: There is no aneurysm or dissection of the visualized ascending aorta or aortic arch. There is a normal 3 vessel branching pattern. The visualized proximal subclavian arteries are normal. Right carotid system: The right common carotid origin is widely patent. There is no common carotid or internal carotid artery dissection or aneurysm. There is mixed calcified and noncalcified plaque at the bifurcation but no hemodynamically significant stenosis. Left carotid system: There is a large calcification along the medial aspect of the left common carotid lumen there results in approximately 60% stenosis. There is no common carotid or internal carotid artery dissection or aneurysm. There is atherosclerotic calcification of the bifurcation but no hemodynamically significant stenosis. Vertebral arteries: The vertebral system is codominant. There is moderate narrowing of both vertebral artery origins secondary to atherosclerotic calcification. There is multifocal atherosclerosis within both V1 segments. The V2 and V3 segments are normal. There is minimal calcification in the left V4 segment. Skeleton: There is no bony spinal canal stenosis. No lytic or blastic lesions. Other neck: The nasopharynx is clear. The oropharynx and hypopharynx are normal. The epiglottis is normal. The supraglottic larynx, glottis and subglottic larynx are normal. No retropharyngeal collection. The parapharyngeal spaces are preserved. The parotid and submandibular glands are normal. No sialolithiasis or salivary ductal dilatation. The  thyroid gland is normal. There is no cervical lymphadenopathy. Upper chest: No pneumothorax or pleural effusion. No nodules or masses. Review of the MIP images confirms the above findings CTA HEAD FINDINGS Anterior circulation: --Intracranial internal carotid arteries: Mild atherosclerotic calcification without hemodynamically significant stenosis. --Anterior cerebral arteries:  Normal. --Middle cerebral arteries: Normal. --Posterior communicating arteries: Present bilaterally Posterior circulation: --Posterior cerebral arteries: Normal. --Superior cerebellar arteries: Normal. --Basilar artery: Normal. --Anterior inferior cerebellar arteries: Normal. --Posterior inferior cerebellar arteries: Normal. Venous sinuses: As permitted by contrast timing, patent. Anatomic variants: Fetal origin of the left vertebral artery. Other findings: There is a superior left convexity extra-axial contrast-enhancing mass that measures 13 x 10 mm. Review of the MIP images confirms the above findings CT Brain Perfusion Findings: CBF (<30%) Volume: 0mL Perfusion (Tmax>6.0s) volume: 0mL Mismatch Volume: 0mL Infarction Location:None IMPRESSION: 1. Normal perfusion scan of the brain. 2. No intracranial or cervical large vessel occlusion. 3. Right carotid bifurcation atherosclerosis without hemodynamically significant stenosis. Chunky calcification within the left common carotid artery, resulting in 60% stenosis. 4. Bilateral moderate narrowing of the vertebral artery origins due to atherosclerotic calcification. Otherwise normal vertebral arteries. 5. Extra-axial contrast-enhancing mass at the superior left convexity, likely meningioma. Electronically Signed   By: Deatra Robinson M.D.   On: 08/06/2016 00:42   Mr Brain Wo Contrast  Result Date: 08/06/2016 CLINICAL DATA:  A aphasia and left-sided weakness. Left facial droop. EXAM: MRI HEAD WITHOUT CONTRAST MRA HEAD WITHOUT CONTRAST TECHNIQUE: Multiplanar, multiecho pulse sequences of the brain and surrounding structures were obtained without intravenous contrast. Angiographic images of the head were obtained using MRA technique without contrast. COMPARISON:  Head CT 08/06/2016 FINDINGS: MRI HEAD FINDINGS Brain: The midline structures are normal. There is a small focus of diffusion restriction measuring approximately 8 mm within the right pons. No other areas of  diffusion restriction. There is encephalomalacia at the site of old left MCA infarct. There are multiple old bilateral lacunar infarcts. There is a large amount of hyperintense T2 weighted signal within the left frontoparietal white matter. There is also a small old right cerebellar infarct. There is a punctate focus of petechial hemorrhage within the right pons. There is hemosiderin deposition in the left MCA distribution. No mass lesion. No chronic microhemorrhage or cerebral amyloid angiopathy. No hydrocephalus, age advanced atrophy or lobar predominant volume loss. No dural abnormality or extra-axial collection. Skull and upper cervical spine: The visualized skull base, calvarium, upper cervical spine and extracranial soft tissues are normal. Sinuses/Orbits: No fluid levels or advanced mucosal thickening. No mastoid effusion. Normal orbits. MRA HEAD FINDINGS Intracranial internal carotid arteries: Normal. Anterior cerebral arteries: Normal. Middle cerebral arteries: Normal. Posterior communicating arteries: Present bilaterally. Posterior cerebral arteries: Normal.  Fetal origin on the left. Basilar artery: Normal. Vertebral arteries: Codominant. Normal. Superior cerebellar arteries: Normal. Anterior inferior cerebellar arteries: Normal. Posterior inferior cerebellar arteries: Normal. IMPRESSION: 1. Acute infarct within the right pons, in keeping with left-sided weakness. Punctate focus of nearby microhemorrhage seen on gradient echo imaging is suspected to be chronic. Regardless, no space-occupying hematoma. 2. Multiple old infarcts, including large left MCA distribution infarct, with associated encephalomalacia. 3. Normal intracranial MRA. Electronically Signed   By: Deatra Robinson M.D.   On: 08/06/2016 05:19   Ct Cerebral Perfusion W Contrast  Result Date: 08/06/2016 CLINICAL DATA:  Left-sided weakness and dysphasia EXAM: CT ANGIOGRAPHY HEAD AND NECK CT PERFUSION BRAIN TECHNIQUE: Multidetector CT imaging of  the head and neck was performed using the standard protocol during bolus administration  of intravenous contrast. Multiplanar CT image reconstructions and MIPs were obtained to evaluate the vascular anatomy. Carotid stenosis measurements (when applicable) are obtained utilizing NASCET criteria, using the distal internal carotid diameter as the denominator. Multiphase CT imaging of the brain was performed following IV bolus contrast injection. Subsequent parametric perfusion maps were calculated using RAPID software. CONTRAST:  100 mL Isovue 370 IV COMPARISON:  None. FINDINGS: CTA NECK FINDINGS Aortic arch: There is no aneurysm or dissection of the visualized ascending aorta or aortic arch. There is a normal 3 vessel branching pattern. The visualized proximal subclavian arteries are normal. Right carotid system: The right common carotid origin is widely patent. There is no common carotid or internal carotid artery dissection or aneurysm. There is mixed calcified and noncalcified plaque at the bifurcation but no hemodynamically significant stenosis. Left carotid system: There is a large calcification along the medial aspect of the left common carotid lumen there results in approximately 60% stenosis. There is no common carotid or internal carotid artery dissection or aneurysm. There is atherosclerotic calcification of the bifurcation but no hemodynamically significant stenosis. Vertebral arteries: The vertebral system is codominant. There is moderate narrowing of both vertebral artery origins secondary to atherosclerotic calcification. There is multifocal atherosclerosis within both V1 segments. The V2 and V3 segments are normal. There is minimal calcification in the left V4 segment. Skeleton: There is no bony spinal canal stenosis. No lytic or blastic lesions. Other neck: The nasopharynx is clear. The oropharynx and hypopharynx are normal. The epiglottis is normal. The supraglottic larynx, glottis and subglottic  larynx are normal. No retropharyngeal collection. The parapharyngeal spaces are preserved. The parotid and submandibular glands are normal. No sialolithiasis or salivary ductal dilatation. The thyroid gland is normal. There is no cervical lymphadenopathy. Upper chest: No pneumothorax or pleural effusion. No nodules or masses. Review of the MIP images confirms the above findings CTA HEAD FINDINGS Anterior circulation: --Intracranial internal carotid arteries: Mild atherosclerotic calcification without hemodynamically significant stenosis. --Anterior cerebral arteries: Normal. --Middle cerebral arteries: Normal. --Posterior communicating arteries: Present bilaterally Posterior circulation: --Posterior cerebral arteries: Normal. --Superior cerebellar arteries: Normal. --Basilar artery: Normal. --Anterior inferior cerebellar arteries: Normal. --Posterior inferior cerebellar arteries: Normal. Venous sinuses: As permitted by contrast timing, patent. Anatomic variants: Fetal origin of the left vertebral artery. Other findings: There is a superior left convexity extra-axial contrast-enhancing mass that measures 13 x 10 mm. Review of the MIP images confirms the above findings CT Brain Perfusion Findings: CBF (<30%) Volume: 0mL Perfusion (Tmax>6.0s) volume: 0mL Mismatch Volume: 0mL Infarction Location:None IMPRESSION: 1. Normal perfusion scan of the brain. 2. No intracranial or cervical large vessel occlusion. 3. Right carotid bifurcation atherosclerosis without hemodynamically significant stenosis. Chunky calcification within the left common carotid artery, resulting in 60% stenosis. 4. Bilateral moderate narrowing of the vertebral artery origins due to atherosclerotic calcification. Otherwise normal vertebral arteries. 5. Extra-axial contrast-enhancing mass at the superior left convexity, likely meningioma. Electronically Signed   By: Deatra Robinson M.D.   On: 08/06/2016 00:42   Dg Chest Port 1 View  Result Date:  08/09/2016 CLINICAL DATA:  Cough with vomiting EXAM: PORTABLE CHEST 1 VIEW COMPARISON:  August 06, 2016 FINDINGS: There is no edema or consolidation. Heart size and pulmonary vascularity are normal. No adenopathy. No bone lesions. There is contrast in the colon. IMPRESSION: No edema or consolidation. Electronically Signed   By: Bretta Bang III M.D.   On: 08/09/2016 09:07   Dg Chest Port 1 View  Result Date: 08/06/2016 CLINICAL DATA:  Dyspnea this evening EXAM: PORTABLE CHEST 1 VIEW COMPARISON:  None. FINDINGS: Borderline cardiomegaly. No aortic aneurysm. Pulmonary vascular congestion consistent with mild CHF. Probable small pleural effusions. No acute nor suspicious osseous abnormality. Degenerative changes are seen along the dorsal spine and both shoulders. IMPRESSION: Borderline cardiomegaly with mild vascular congestion consistent with CHF. Probable bilateral small pleural effusions. Electronically Signed   By: Tollie Eth M.D.   On: 08/06/2016 02:41   Dg Abd Portable 1v  Result Date: 08/09/2016 CLINICAL DATA:  Vomiting episodes this morning. Possible aspiration. EXAM: PORTABLE ABDOMEN - 1 VIEW COMPARISON:  Swallowing study 08/07/2016 FINDINGS: Single view of the abdomen was obtained. There is oral contrast in the abdomen and pelvis, predominantly in the colon. This contrast is from the recent swallowing examination. Nonobstructive bowel gas pattern. Prominent vascular calcifications in the pelvis. Limited evaluation for abdominal calcifications due to the barium within the abdomen and pelvis. IMPRESSION: Normal bowel gas pattern. Electronically Signed   By: Richarda Overlie M.D.   On: 08/09/2016 10:18   Dg Swallowing Func-speech Pathology  Result Date: 08/07/2016 Objective Swallowing Evaluation: Type of Study: MBS-Modified Barium Swallow Study Patient Details Name: KWAME RYLAND MRN: [id number] Date of Birth: 08-29-1950 Today's Date: 08/07/2016 Time: SLP Start Time (ACUTE ONLY): 1040-SLP Stop Time  (ACUTE ONLY): 1100 SLP Time Calculation (min) (ACUTE ONLY): 20 min Past Medical History: Past Medical History: Diagnosis Date . CVA (cerebral vascular accident) (HCC)  . DM type 2 (diabetes mellitus, type 2) (HCC)  . HTN (hypertension)  Past Surgical History: No past surgical history on file. HPI: Pt is a 66 y.o.maleadmitted with acute onset of left sided weakness, aphasia, and facial droop. 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 No prior SLP notes are available, but pt denies any h/o dysphagia. Subjective: alert, difficult to understand Assessment / Plan / Recommendation CHL IP CLINICAL IMPRESSIONS 08/07/2016 Clinical Impression Pt demonstrates a moderate to severe oral and oropharyngeal dysphagia. Lingual weakness results in impared manipulation and formation of bolus with premature spillage, pumping mechanism and oral residuals. Oropharyngeal phase characterzied by delayed swallow initiation leading to aspiration before the swallow with thin liquid and large nectar thick boluses; sensation only with large quantity of aspiration. There is also significant base of tongue weakenss with mdoerate vallcuelar residuals. Also impacting pts function is difficulty following instructions due to aphasia and impulsivity and decreased awareness with self feeding. SLP had to provide max assist to decrease bolus size, resorting finally to spoon feeding. Pt also need max visual and tactile assist to attempt positional strategies (chin tuck, head turn/tilt - not effective in reducing residue). Pt also could not achieve a consistent second swallow or effortful swallo with commands and volitional cough was very weak. Pt will be at risk of aspiration with all PO consistencies, but best conservative consistency to test for toelrance is dys 1 (puree) with nectar thick liquids via teaspoon. Pt will need base of tongue exercises and traning for a second swallow,  rate control and possible RMT. Will follow for tolerance and therapy.  SLP Visit Diagnosis Dysphagia, oropharyngeal phase (R13.12) Attention and concentration deficit following -- Frontal lobe and executive function deficit following -- Impact on safety and function Severe aspiration risk   CHL IP TREATMENT RECOMMENDATION 08/07/2016 Treatment Recommendations Therapy as outlined in treatment plan below   Prognosis 08/07/2016 Prognosis for Safe Diet Advancement Good Barriers to Reach Goals -- Barriers/Prognosis Comment -- CHL IP DIET  RECOMMENDATION 08/07/2016 SLP Diet Recommendations Dysphagia 1 (Puree) solids;Nectar thick liquid Liquid Administration via Spoon Medication Administration Crushed with puree Compensations Slow rate;Small sips/bites;Monitor for anterior loss;Multiple dry swallows after each bite/sip Postural Changes Seated upright at 90 degrees;Remain semi-upright after after feeds/meals (Comment)   CHL IP OTHER RECOMMENDATIONS 08/07/2016 Recommended Consults -- Oral Care Recommendations Oral care BID Other Recommendations Have oral suction available   CHL IP FOLLOW UP RECOMMENDATIONS 08/07/2016 Follow up Recommendations Inpatient Rehab   CHL IP FREQUENCY AND DURATION 08/07/2016 Speech Therapy Frequency (ACUTE ONLY) min 2x/week Treatment Duration 2 weeks      CHL IP ORAL PHASE 08/07/2016 Oral Phase Impaired Oral - Pudding Teaspoon -- Oral - Pudding Cup -- Oral - Honey Teaspoon -- Oral - Honey Cup -- Oral - Nectar Teaspoon Weak lingual manipulation;Lingual pumping;Incomplete tongue to palate contact;Reduced posterior propulsion;Right pocketing in lateral sulci;Lingual/palatal residue;Delayed oral transit;Decreased bolus cohesion;Premature spillage Oral - Nectar Cup Weak lingual manipulation;Lingual pumping;Incomplete tongue to palate contact;Reduced posterior propulsion;Right pocketing in lateral sulci;Lingual/palatal residue;Delayed oral transit;Decreased bolus cohesion;Premature spillage;Right anterior bolus  loss Oral - Nectar Straw Impaired mastication;Lingual pumping;Incomplete tongue to palate contact;Reduced posterior propulsion;Right pocketing in lateral sulci;Lingual/palatal residue;Delayed oral transit;Decreased bolus cohesion;Premature spillage Oral - Thin Teaspoon Lingual pumping;Incomplete tongue to palate contact;Reduced posterior propulsion;Right pocketing in lateral sulci;Lingual/palatal residue;Delayed oral transit;Decreased bolus cohesion;Premature spillage;Weak lingual manipulation Oral - Thin Cup Right anterior bolus loss;Impaired mastication;Weak lingual manipulation;Lingual pumping;Incomplete tongue to palate contact;Reduced posterior propulsion;Right pocketing in lateral sulci;Lingual/palatal residue;Delayed oral transit;Decreased bolus cohesion;Premature spillage Oral - Thin Straw Weak lingual manipulation;Lingual pumping;Incomplete tongue to palate contact;Reduced posterior propulsion;Right pocketing in lateral sulci;Lingual/palatal residue;Delayed oral transit;Decreased bolus cohesion;Premature spillage Oral - Puree Weak lingual manipulation;Lingual pumping;Incomplete tongue to palate contact;Reduced posterior propulsion;Right pocketing in lateral sulci;Delayed oral transit;Decreased bolus cohesion;Premature spillage;Lingual/palatal residue Oral - Mech Soft -- Oral - Regular Impaired mastication;Weak lingual manipulation;Lingual pumping;Incomplete tongue to palate contact;Reduced posterior propulsion;Right pocketing in lateral sulci;Lingual/palatal residue;Delayed oral transit;Decreased bolus cohesion;Premature spillage Oral - Multi-Consistency -- Oral - Pill -- Oral Phase - Comment --  CHL IP PHARYNGEAL PHASE 08/07/2016 Pharyngeal Phase Impaired Pharyngeal- Pudding Teaspoon -- Pharyngeal -- Pharyngeal- Pudding Cup -- Pharyngeal -- Pharyngeal- Honey Teaspoon -- Pharyngeal -- Pharyngeal- Honey Cup -- Pharyngeal -- Pharyngeal- Nectar Teaspoon Delayed swallow initiation-pyriform sinuses;Reduced  epiglottic inversion;Reduced tongue base retraction;Pharyngeal residue - valleculae;Pharyngeal residue - pyriform Pharyngeal -- Pharyngeal- Nectar Cup Delayed swallow initiation-pyriform sinuses;Reduced epiglottic inversion;Reduced tongue base retraction;Pharyngeal residue - valleculae;Pharyngeal residue - pyriform;Penetration/Aspiration before swallow;Significant aspiration (Amount);Compensatory strategies attempted (with notebox) Pharyngeal Material enters airway, passes BELOW cords and not ejected out despite cough attempt by patient Pharyngeal- Nectar Straw Delayed swallow initiation-pyriform sinuses;Reduced epiglottic inversion;Reduced tongue base retraction;Pharyngeal residue - valleculae;Pharyngeal residue - pyriform;Penetration/Aspiration before swallow;Significant aspiration (Amount);Compensatory strategies attempted (with notebox);Penetration/Aspiration during swallow;Trace aspiration Pharyngeal Material enters airway, passes BELOW cords without attempt by patient to eject out (silent aspiration);Material enters airway, CONTACTS cords and not ejected out;Material enters airway, remains ABOVE vocal cords and not ejected out Pharyngeal- Thin Teaspoon Delayed swallow initiation-pyriform sinuses;Reduced epiglottic inversion;Reduced tongue base retraction;Pharyngeal residue - valleculae;Pharyngeal residue - pyriform;Penetration/Aspiration before swallow;Compensatory strategies attempted (with notebox);Penetration/Aspiration during swallow;Trace aspiration;Reduced airway/laryngeal closure;Inter-arytenoid space residue Pharyngeal Material enters airway, passes BELOW cords without attempt by patient to eject out (silent aspiration);Material enters airway, CONTACTS cords and not ejected out Pharyngeal- Thin Cup Delayed swallow initiation-pyriform sinuses;Reduced epiglottic inversion;Reduced tongue base retraction;Pharyngeal residue - valleculae;Pharyngeal residue - pyriform;Penetration/Aspiration before  swallow;Compensatory strategies attempted (with notebox);Penetration/Aspiration during swallow;Trace aspiration;Reduced airway/laryngeal closure;Inter-arytenoid space residue Pharyngeal Material enters airway, passes BELOW cords  without attempt by patient to eject out (silent aspiration);Material enters airway, CONTACTS cords and not ejected out Pharyngeal- Thin Straw Delayed swallow initiation-pyriform sinuses;Reduced epiglottic inversion;Reduced tongue base retraction;Pharyngeal residue - valleculae;Pharyngeal residue - pyriform;Penetration/Aspiration before swallow;Compensatory strategies attempted (with notebox);Penetration/Aspiration during swallow;Trace aspiration;Reduced airway/laryngeal closure;Inter-arytenoid space residue Pharyngeal -- Pharyngeal- Puree Delayed swallow initiation-pyriform sinuses;Reduced epiglottic inversion;Reduced tongue base retraction;Pharyngeal residue - valleculae;Pharyngeal residue - pyriform Pharyngeal -- Pharyngeal- Mechanical Soft -- Pharyngeal -- Pharyngeal- Regular Delayed swallow initiation-pyriform sinuses;Reduced epiglottic inversion;Reduced tongue base retraction;Pharyngeal residue - valleculae;Pharyngeal residue - pyriform Pharyngeal -- Pharyngeal- Multi-consistency -- Pharyngeal -- Pharyngeal- Pill -- Pharyngeal -- Pharyngeal Comment --  No flowsheet data found. No flowsheet data found. Harlon Ditty, [place] CCC-SLP [phone] Claudine Mouton 08/07/2016, 11:58 AM              Mr Maxine Glenn Head/brain Wo Cm  Result Date: 08/06/2016 CLINICAL DATA:  A aphasia and left-sided weakness. Left facial droop. EXAM: MRI HEAD WITHOUT CONTRAST MRA HEAD WITHOUT CONTRAST TECHNIQUE: Multiplanar, multiecho pulse sequences of the brain and surrounding structures were obtained without intravenous contrast. Angiographic images of the head were obtained using MRA technique without contrast. COMPARISON:  Head CT 08/06/2016 FINDINGS: MRI HEAD FINDINGS Brain: The midline structures are normal.  There is a small focus of diffusion restriction measuring approximately 8 mm within the right pons. No other areas of diffusion restriction. There is encephalomalacia at the site of old left MCA infarct. There are multiple old bilateral lacunar infarcts. There is a large amount of hyperintense T2 weighted signal within the left frontoparietal white matter. There is also a small old right cerebellar infarct. There is a punctate focus of petechial hemorrhage within the right pons. There is hemosiderin deposition in the left MCA distribution. No mass lesion. No chronic microhemorrhage or cerebral amyloid angiopathy. No hydrocephalus, age advanced atrophy or lobar predominant volume loss. No dural abnormality or extra-axial collection. Skull and upper cervical spine: The visualized skull base, calvarium, upper cervical spine and extracranial soft tissues are normal. Sinuses/Orbits: No fluid levels or advanced mucosal thickening. No mastoid effusion. Normal orbits. MRA HEAD FINDINGS Intracranial internal carotid arteries: Normal. Anterior cerebral arteries: Normal. Middle cerebral arteries: Normal. Posterior communicating arteries: Present bilaterally. Posterior cerebral arteries: Normal.  Fetal origin on the left. Basilar artery: Normal. Vertebral arteries: Codominant. Normal. Superior cerebellar arteries: Normal. Anterior inferior cerebellar arteries: Normal. Posterior inferior cerebellar arteries: Normal. IMPRESSION: 1. Acute infarct within the right pons, in keeping with left-sided weakness. Punctate focus of nearby microhemorrhage seen on gradient echo imaging is suspected to be chronic. Regardless, no space-occupying hematoma. 2. Multiple old infarcts, including large left MCA distribution infarct, with associated encephalomalacia. 3. Normal intracranial MRA. Electronically Signed   By: Deatra Robinson M.D.   On: 08/06/2016 05:19   Ct Head Code Stroke W/o Cm  Result Date: 08/06/2016 CLINICAL DATA:  Code stroke.  Left-sided weakness, drooling and slurred speech EXAM: CT HEAD WITHOUT CONTRAST TECHNIQUE: Contiguous axial images were obtained from the base of the skull through the vertex without intravenous contrast. COMPARISON:  None. FINDINGS: Brain: There is encephalomalacia and the left MCA distribution, likely chronic infarct. There are multiple age-indeterminate lacunar infarcts of the right basal ganglia and adjacent white matter. No midline shift or mass effect. No acute hemorrhage. Vascular: No hyperdense vessel. Atherosclerotic calcification of the vertebral and internal carotid arteries at the skull base. Skull: Normal. Negative for fracture or focal lesion. Sinuses/Orbits: The visualized portions of the paranasal sinuses and mastoid air cells are free of fluid.  No advanced mucosal thickening. The visualized orbits are normal. Other: None ASPECTS (Alberta Stroke Program Early CT Score) - Ganglionic level infarction (caudate, lentiform nuclei, internal capsule, insula, M1-M3 cortex): 6 - Supraganglionic infarction (M4-M6 cortex): 2 Total score (0-10 with 10 being normal): 8 IMPRESSION: 1. Age indeterminate lacunar infarcts of the right basal ganglia and thalamus. MRI is recommended for more definitive temporal characterization. 2. No acute hemorrhage. 3. Multiple old infarcts including the left MCA distribution and left basal ganglia/thalamus. 4. ASPECTS is 8. These results were called by telephone at the time of interpretation on 08/06/2016 at 12:11 am to Dr. Caryl Pina who verbally acknowledged these results. Electronically Signed   By: Deatra Robinson M.D.   On: 08/06/2016 00:11    Microbiology: Recent Results (from the past 240 hour(s))  MRSA PCR Screening     Status: None   Collection Time: 08/06/16  9:48 AM  Result Value Ref Range Status   MRSA by PCR NEGATIVE NEGATIVE Final    Comment:        The GeneXpert MRSA Assay (FDA approved for NASAL specimens only), is one component of a comprehensive MRSA  colonization surveillance program. It is not intended to diagnose MRSA infection nor to guide or monitor treatment for MRSA infections.      Labs: Basic Metabolic Panel:  Recent Labs Lab 08/05/16 2346 08/05/16 2356 08/07/16 0305 08/09/16 1016  NA 137 140 137 137  K 4.4 4.4 4.4 4.4  CL 106 106 105 106  CO2 23  --  23 23  GLUCOSE 115* 114* 97 142*  BUN 26* 28* 21* 22*  CREATININE 1.40* 1.40* 1.19 1.13  CALCIUM 9.2  --  9.1 9.0   Liver Function Tests:  Recent Labs Lab 08/05/16 2346  AST 19  ALT 13*  ALKPHOS 57  BILITOT 0.5  PROT 7.5  ALBUMIN 3.4*   No results for input(s): LIPASE, AMYLASE in the last 168 hours. No results for input(s): AMMONIA in the last 168 hours. CBC:  Recent Labs Lab 08/05/16 2346 08/05/16 2356 08/07/16 0305 08/09/16 1016 08/10/16 0031  WBC 15.5*  --  10.3 12.7* 10.2  NEUTROABS 11.9*  --  7.9*  --   --   HGB 9.2* 9.5* 9.2* 9.5* 9.4*  HCT 29.3* 28.0* 30.0* 31.0* 29.2*  MCV 84.7  --  83.8 84.2 83.7  PLT 300  --  312 298 273   Cardiac Enzymes: No results for input(s): CKTOTAL, CKMB, CKMBINDEX, TROPONINI in the last 168 hours. BNP: BNP (last 3 results) No results for input(s): BNP in the last 8760 hours.  ProBNP (last 3 results) No results for input(s): PROBNP in the last 8760 hours.  CBG:  Recent Labs Lab 08/10/16 1127 08/10/16 1646 08/10/16 2130 08/11/16 0634 08/11/16 1110  GLUCAP 125* 175* 130* 164* 151*    Signed:  Penny Pia MD.  [facility] 08/11/2016, 2:06 PM

## 2016-08-11 NOTE — Progress Notes (Signed)
RN discussed discharge instructions with patient, nodded head to demonstrate he understood f/u appts, medication changes, s/sx stroke. Will continue to monitor until transport arrives.

## 2016-08-11 NOTE — Clinical Social Work Note (Signed)
Clinical Social Worker facilitated patient discharge including contacting patient family and facility to confirm patient discharge plans.  Clinical information faxed to facility and family agreeable with plan.  CSW arranged ambulance transport via [facility] to [facility] .  RN to call [phone] for report prior to discharge.  Clinical Social Worker will sign off for now as social work intervention is no longer needed. Please consult [place] again if new need arises.  [place], [place] [phone]

## 2016-08-11 NOTE — Progress Notes (Signed)
STROKE TEAM PROGRESS NOTE   SUBJECTIVE (INTERVAL HISTORY) Called by medicine team for followup. Patient was seen by Dr Otelia Limes but not added to stroke team rounding list hence not seen last 2 days   OBJECTIVE Temp:  [98 F (36.7 C)-99.4 F (37.4 C)] 98.5 F (36.9 C) (05/01 0908) Pulse Rate:  [91-105] 103 (05/01 0908) Cardiac Rhythm: Heart block (05/01 0700) Resp:  [20] 20 (05/01 0908) BP: (111-145)/(54-68) 111/54 (05/01 0908) SpO2:  [96 %-100 %] 100 % (05/01 0908)  CBC:  Recent Labs Lab 08/05/16 2346  08/07/16 0305 08/09/16 1016 08/10/16 0031  WBC 15.5*  --  10.3 12.7* 10.2  NEUTROABS 11.9*  --  7.9*  --   --   HGB 9.2*  < > 9.2* 9.5* 9.4*  HCT 29.3*  < > 30.0* 31.0* 29.2*  MCV 84.7  --  83.8 84.2 83.7  PLT 300  --  312 298 273  < > = values in this interval not displayed.  Basic Metabolic Panel:  Recent Labs Lab 08/07/16 0305 08/09/16 1016  NA 137 137  K 4.4 4.4  CL 105 106  CO2 23 23  GLUCOSE 97 142*  BUN 21* 22*  CREATININE 1.19 1.13  CALCIUM 9.1 9.0   IMAGING  MRI  R pontine infarct. Chronic microhemorrhages w/o hematoma. Multiple old infarcts L MCA.  MRA  Unremarkable   PHYSICAL EXAM Frail middle-aged African-American male currently not in distress. . Afebrile. Head is nontraumatic. Neck is supple without bruit.    Cardiac exam no murmur or gallop. Lungs are clear to auscultation. Distal pulses are well felt. Neurological Exam :  Awake alert speech is nonfluent hesitant with mild expressive aphasia. Slight dysarthria. Can be understood. Extraocular moments are full range without nystagmus. Blinks to threat more on the left than the right. Right lower facial weakness. Jaw jerk is brisk. Spastic right hemiparesis graded 2-3/5 strength in the right upper extremity and 4/5 strength in the right. Mild left lower extremity drift. Mild weakness of left grip and intrinsic hand muscles. Deep tendon reflexes are brisk asymmetric more on the right than the left. Both  plantars equivocal. Gait was not tested. ASSESSMENT/PLAN Malik Bruce is a 66 y.o. male with history of previous stroke with R HP, HTN, HLD, DM, CKD presenting with receptive and expressive aphasia. He did not receive IV t-PA due to out of the time window.   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 no acute hmg. Old L MCA and L BG/thalamic infarcts. Aspects 8.    CTA H&N no LVO. R ICA atherosclerosis. L CCA calcification w/ 60% stenosis. Narrowing B VA origins. Likely meningioma  CTP normal  MRI  R pontine infarct. Chronic microhemorrhages w/o hematoma. Multiple old infarcts L MCA.  MRA  Unremarkable   2D Echo  EF 55-60%. No source of embolus   LDL 40  HgbA1c 5.3  SCDs for VTE prophylaxis  DIET - DYS 1 Room service appropriate? Yes; Fluid consistency: Nectar Thick  aspirin 325 mg daily prior to admission, now on Plavix  Ongoing aggressive stroke risk factor management  Therapy recommendations:  SNF  Disposition:  pending  (from nursing facility in [place])  Hypertension  Stable  Hyperlipidemia  Home meds:  Atorvastatin 40 mg daily,  resumed in hospital    LDL 40, goal < 70  Continue statin at discharge  Diabetes type II  HgbA1c 5.3, goal < 7.0  Controlled  Other Stroke Risk Factors  Advanced age  UDS / ETOH level not performed   Hx stroke/TIA listed, no information in EMR  Other Active Problems  Lekukocytosis  Anemia d/t CKD  CKD  Hospital day # 5  I have personally examined this patient, reviewed notes, independently viewed imaging studies, participated in medical decision making and plan of care.ROS completed by me personally and pertinent positives fully documented  I have made any additions or clarifications directly to the above note. Patient has a severe spastic right hemiparesis from an old stroke and presented for evaluation with slurred  speech and left arm weakness. MRI scan shows) within pontine infarct from small vessel disease. Agree with changing aspirin to Plavix for stroke prevention and continue aggressive risk factor modification. Transfer to skilled nursing facility for rehabilitation. On discussion with patient and Dr. Anette Riedel and answered questions. Greater than 50% time during this 25 minute visit was spent on counseling and coordination of care about his stroke, spastic hemiparesis and answered questions Delia Heady, MD Medical Director [facility] Pager: [phone] 08/11/2016 2:39 PM   To contact Stroke Continuity provider, please refer to [email or web address]. After hours, contact General Neurology

## 2016-11-24 ENCOUNTER — Encounter: Payer: Self-pay | Admitting: Emergency Medicine

## 2016-11-24 ENCOUNTER — Emergency Department
Admission: EM | Admit: 2016-11-24 | Discharge: 2016-11-24 | Disposition: A | Discharge status: Home / Self Care | Payer: Medicare Other | Attending: Emergency Medicine | Admitting: Emergency Medicine

## 2016-11-24 ENCOUNTER — Emergency Department: Payer: Medicare Other

## 2016-11-24 DIAGNOSIS — Z87891 Personal history of nicotine dependence: Secondary | ICD-10-CM | POA: Insufficient documentation

## 2016-11-24 DIAGNOSIS — E119 Type 2 diabetes mellitus without complications: Secondary | ICD-10-CM | POA: Insufficient documentation

## 2016-11-24 DIAGNOSIS — Z794 Long term (current) use of insulin: Secondary | ICD-10-CM | POA: Insufficient documentation

## 2016-11-24 DIAGNOSIS — I10 Essential (primary) hypertension: Secondary | ICD-10-CM | POA: Diagnosis not present

## 2016-11-24 DIAGNOSIS — R5383 Other fatigue: Secondary | ICD-10-CM | POA: Diagnosis not present

## 2016-11-24 DIAGNOSIS — Z7984 Long term (current) use of oral hypoglycemic drugs: Secondary | ICD-10-CM | POA: Insufficient documentation

## 2016-11-24 DIAGNOSIS — R4182 Altered mental status, unspecified: Secondary | ICD-10-CM | POA: Diagnosis present

## 2016-11-24 DIAGNOSIS — Z79899 Other long term (current) drug therapy: Secondary | ICD-10-CM | POA: Diagnosis not present

## 2016-11-24 LAB — URINALYSIS, COMPLETE (UACMP) WITH MICROSCOPIC
Bilirubin Urine: NEGATIVE
Glucose, UA: NEGATIVE mg/dL
HGB URINE DIPSTICK: NEGATIVE
KETONES UR: NEGATIVE mg/dL
Leukocytes, UA: NEGATIVE
NITRITE: NEGATIVE
Protein, ur: NEGATIVE mg/dL
Specific Gravity, Urine: 1.006 (ref 1.005–1.030)
Squamous Epithelial / LPF: NONE SEEN
pH: 6 (ref 5.0–8.0)

## 2016-11-24 LAB — HEPATIC FUNCTION PANEL
ALT: 10 U/L — ABNORMAL LOW (ref 17–63)
AST: 19 U/L (ref 15–41)
Albumin: 3.3 g/dL — ABNORMAL LOW (ref 3.5–5.0)
Alkaline Phosphatase: 54 U/L (ref 38–126)
BILIRUBIN TOTAL: 0.4 mg/dL (ref 0.3–1.2)
Total Protein: 7.8 g/dL (ref 6.5–8.1)

## 2016-11-24 LAB — CBC
HCT: 31.5 % — ABNORMAL LOW (ref 40.0–52.0)
HEMOGLOBIN: 10.2 g/dL — AB (ref 13.0–18.0)
MCH: 26.5 pg (ref 26.0–34.0)
MCHC: 32.3 g/dL (ref 32.0–36.0)
MCV: 82.1 fL (ref 80.0–100.0)
Platelets: 362 10*3/uL (ref 150–440)
RBC: 3.84 MIL/uL — AB (ref 4.40–5.90)
RDW: 14.9 % — ABNORMAL HIGH (ref 11.5–14.5)
WBC: 9.7 10*3/uL (ref 3.8–10.6)

## 2016-11-24 LAB — BASIC METABOLIC PANEL
ANION GAP: 6 (ref 5–15)
BUN: 24 mg/dL — ABNORMAL HIGH (ref 6–20)
CALCIUM: 9.2 mg/dL (ref 8.9–10.3)
CO2: 26 mmol/L (ref 22–32)
Chloride: 108 mmol/L (ref 101–111)
Creatinine, Ser: 1.19 mg/dL (ref 0.61–1.24)
Glucose, Bld: 95 mg/dL (ref 65–99)
Potassium: 4.5 mmol/L (ref 3.5–5.1)
Sodium: 140 mmol/L (ref 135–145)

## 2016-11-24 LAB — TROPONIN I: Troponin I: <0.03 ng/mL (ref <0.03)

## 2016-11-24 NOTE — ED Notes (Signed)
In and out cath completed by this RN. Assisted by Rosey Batheresa, RN. Patient tolerated well. Sterile technique maintained. Clear yellow urine returned. Soiled brief and linen changed at this time.

## 2016-11-24 NOTE — ED Triage Notes (Signed)
Patient presents to ED via [facility] from [facility]. Staff called due to generalized fatigue and increased "tiredness". Patient denies any symptoms at this time. Patient denies pain. VSS. Hx of stroke with right sided deficits and slurred speech. Patient speech today is his normal according to staff at care home.

## 2016-11-24 NOTE — ED Notes (Signed)
Called [facility] for transportation back to their facility. They said they would be sending someone.

## 2016-11-24 NOTE — ED Notes (Signed)
Report taken from MacdoelEmma R.N.

## 2016-11-24 NOTE — Discharge Instructions (Signed)
Lab workup, CT head and chest x-ray in the emergency room were negative.  Return to the ER for new, worsening or recurrent symptoms.  Follow up with the regular doctor.

## 2016-11-24 NOTE — ED Provider Notes (Signed)
Signout from Dr. Marisa SeverinSiadecki in this 66 year old male who had an initial EKG with static in the baseline with unclear P waves. Sinusitis to check a repeat EKG for presence of P waves. The patient may be discharged as long as the EKG shows sinus rhythm.  Physical Exam  BP (!) 150/74   Pulse 81   Temp 97.9 F (36.6 C) (Oral)   Resp 14   Ht 5\' 6"  (1.676 m)   Wt 72.6 kg (160 lb)   SpO2 98%   BMI 25.82 kg/m   Physical Exam  ED Course  Procedures  MDM ED ECG REPORT I, Arelia LongestSchaevitz,  Elbie Statzer M, the attending physician, personally viewed and interpreted this ECG.   Date: 11/24/2016  EKG Time: 1643  Rate: 78  Rhythm: normal sinus rhythm  Axis: Normal  Intervals:first-degree A-V block   ST&T Change: No ST segment elevation or depression. No abnormal T-wave inversion.  Patient with clear P waves in V2, V3, V4 as well as leads 3 and aVF. Patient will be discharged at this time.        Myrna BlazerSchaevitz, Kimberli Winne Matthew, MD 11/24/16 [id number]

## 2016-11-24 NOTE — ED Provider Notes (Signed)
[facility] Emergency Department Provider Note ____________________________________________   First MD Initiated Contact with Patient 11/24/16 1201     (approximate)  I have reviewed the triage vital signs and the nursing notes.   HISTORY  Chief Complaint Fatigue History of present illness is limited by CVA and speech deficit.   HPI Malik Bruce is a 66 y.o. male who presents with fatigue for 1 day and possible altered mental status. Per family members who last saw him yesterdayand he was at his baseline, today he appears more tired and his speech which is normally slurred and difficult to understand is somewhat worse than his baseline. Patient also seems to be talking more than baseline. Patient denies any acute complaints this time.  Past Medical History:  Diagnosis Date  . CVA (cerebral vascular accident) (HCC)   . DM type 2 (diabetes mellitus, type 2) (HCC)   . HTN (hypertension)     Patient Active Problem List   Diagnosis Date Noted  . CVA (cerebral vascular accident) (HCC) 08/06/2016  . Acute metabolic encephalopathy 08/06/2016  . Leukocytosis 08/06/2016  . Anemia due to chronic kidney disease 08/06/2016  . Renal insufficiency 08/06/2016  . Hyperlipidemia 08/06/2016  . CVA, old, hemiparesis (HCC) 08/06/2016    History reviewed. No pertinent surgical history.  Prior to Admission medications   Medication Sig Start Date End Date Taking? Authorizing Provider  acetaminophen (TYLENOL) 500 MG tablet Take 500 mg by mouth See admin instructions. Take 500 mg by mouth in the morning. Take 500 mg by mouth every 4 hours as needed for fever 99.5-101F, headache or minor discomfort.   Yes [provider]  alum & mag hydroxide-simeth (MINTOX) 200-200-20 MG/5ML suspension Take 30 mLs by mouth as needed for indigestion or heartburn (Do not exceed 4 doses in 24 hours).   Yes [provider]  atorvastatin (LIPITOR) 40 MG tablet Take 40 mg by  mouth daily.   Yes [provider]  b complex vitamins tablet Take 1 tablet by mouth daily.   Yes [provider]  baclofen (LIORESAL) 10 MG tablet Take 10 mg by mouth 3 (three) times daily.   Yes [provider]  clopidogrel (PLAVIX) 75 MG tablet Take 1 tablet (75 mg total) by mouth daily. 08/11/16  Yes Penny Pia, MD  cyanocobalamin (,VITAMIN B-12,) 1000 MCG/ML injection Inject 1,000 mcg into the muscle every 30 (thirty) days. On the 11th of every month   Yes [provider]  ferrous sulfate 325 (65 FE) MG tablet Take 325 mg by mouth 2 (two) times daily.   Yes [provider]  fluticasone (FLONASE) 50 MCG/ACT nasal spray Place 1 spray into both nostrils daily.   Yes [provider]  furosemide (LASIX) 20 MG tablet Take 10 mg by mouth daily.    Yes [provider]  guaifenesin (ROBAFEN) 100 MG/5ML syrup Take 200 mg by mouth every 6 (six) hours as needed for cough (Do not exceed 4 doses in 24 hours).   Yes [provider]  Melatonin 3 MG TABS Take 3 mg by mouth at bedtime.   Yes [provider]  metFORMIN (GLUCOPHAGE) 500 MG tablet Take 1,000 mg by mouth 2 (two) times daily with a meal.   Yes [provider]  Metoprolol Succinate 25 MG CS24 Take 12.5 mg by mouth daily.   Yes [provider]  pregabalin (LYRICA) 50 MG capsule Take 50 mg by mouth 3 (three) times daily.   Yes [provider]  insulin detemir (LEVEMIR) 100 UNIT/ML injection Inject 0.07 mLs (7 Units total) into the skin daily. Patient not taking: Reported on 11/24/2016 08/11/16   Penny Pia, MD    Allergies Patient has no known allergies.  No family history on file.  Social History Social History  Substance Use Topics  . Smoking status: Former Games developer  . Smokeless tobacco: Former Neurosurgeon  . Alcohol use No    Review of Systems Level V caveat: Unable to obtain review of systems due to CVA, speech  deficit    ____________________________________________   PHYSICAL EXAM:  VITAL SIGNS: ED Triage Vitals  Enc Vitals Group     BP 11/24/16 1135 140/71     Pulse Rate 11/24/16 1135 80     Resp 11/24/16 1135 15     Temp 11/24/16 1135 97.9 F (36.6 C)     Temp Source 11/24/16 1135 Oral     SpO2 11/24/16 1135 98 %     Weight 11/24/16 1136 160 lb (72.6 kg)     Height 11/24/16 1136 5\' 6"  (1.676 m)     Head Circumference --      Peak Flow --      Pain Score --      Pain Loc --      Pain Edu? --      Excl. in GC? --     Constitutional: Alert, Not oriented, but responding to commands. Eyes: Conjunctivae are normal.  Head: Atraumatic. Nose: No congestion/rhinnorhea. Mouth/Throat: Slightly dry mucous members. Neck: Normal range of motion.  Cardiovascular: Normal rate, regular rhythm. Grossly normal heart sounds.  Good peripheral circulation. Respiratory: Normal respiratory effort.  No retractions. Lungs CTAB. Gastrointestinal: Soft and nontender. No distention.  Genitourinary: No CVA tenderness. Musculoskeletal: No lower extremity edema.  Extremities warm and well perfused.  Neurologic:  Speech is slurred, speaking somewhat incoherently but able to answer some questions. Chronic right upper extremity weakness, but no focal acute neurologic deficits. Skin:  Skin is warm and dry. No rash noted. Psychiatric: Mood and affect are normal. Speech is slurred  ____________________________________________   LABS (all labs ordered are listed, but only abnormal results are displayed)  Labs Reviewed  BASIC METABOLIC PANEL - Abnormal; Notable for the following:       Result Value   BUN 24 (*)    All other components within normal limits  CBC - Abnormal; Notable for the following:    RBC 3.84 (*)    Hemoglobin 10.2 (*)    HCT 31.5 (*)    RDW 14.9 (*)    All other components within normal limits  URINALYSIS, COMPLETE (UACMP) WITH MICROSCOPIC - Abnormal; Notable for the following:     Color, Urine YELLOW (*)    APPearance CLEAR (*)    Bacteria, UA RARE (*)    All other components within normal limits  HEPATIC FUNCTION PANEL - Abnormal; Notable for the following:    Albumin 3.3 (*)    ALT 10 (*)    Bilirubin, Direct <0.1 (*)    All other components within normal limits  TROPONIN I   ____________________________________________  EKG  ED ECG REPORT I, Dionne Bucy, the attending physician, personally viewed and interpreted this ECG.  Date: 11/24/2016 EKG Time: 13:12 Rate: 77 Rhythm: sinus rhythm QRS Axis: normal Intervals: Long PR interval ST/T Wave abnormalities: Nonspecific findings Narrative Interpretation: Computer read it as complete heart block, which appears erroneous. EKG appears to have prolonged PR interval. No other acute findings.  ____________________________________________  RADIOLOGY    ____________________________________________   PROCEDURES  Procedure(s) performed: No    Critical Care performed:No ____________________________________________   INITIAL IMPRESSION / ASSESSMENT AND PLAN / ED COURSE  Pertinent labs & imaging results that were available during my care of the patient were reviewed by me and considered in my medical decision making (see chart for details).  66 year old male nursing home resident history of speech difficulty and right upper extremity weakness after prior CVA presents with generalized fatigue for 1 day associated with possible change in mental status per family members. Per family, patient's speech which is normally slurred slightly worse than usual. No other acute neuro deficits. Vital signs are normal, patient is well-appearing, and no other focal exam findings. Differential includes dehydration or other metabolic cause, infection, less likely cardiac or CNS cause. Plan for weakness/AMS workup, including cardiac enzymes, chest x-ray and CT head, and reassess.  Clinical Course as of Nov 24 1645   Tue Nov 24, 2016  1411 MCHC: 32.3 [SS]  1636 Appearance: Cathlean Cower(!) CLEAR [SS]    Clinical Course User Index [SS] Dionne BucySiadecki, Kelena Garrow, MD    ----------------------------------------- 4:29 PM on 11/24/2016 -----------------------------------------  Lab workup as well as chest x-ray and CT head are unremarkable. Chest x-ray shows possible atelectasis. Given no fever, cough, or elevated white count there is no evidence clinically for pneumonia. Will repeat EKG to clarify finding of prolonged PR interval. If no other acute findings on repeat EKG will discharge home. Plan discussed with family members at bedside.  ____________________________________________   FINAL CLINICAL IMPRESSION(S) / ED DIAGNOSES  Final diagnoses:  Fatigue, unspecified type      NEW MEDICATIONS STARTED DURING THIS VISIT:  New Prescriptions   No medications on file     Note:  This document was prepared using Dragon voice recognition software and may include unintentional dictation errors.    Dionne BucySiadecki, Trenda Corliss, MD 11/24/16 [phone]

## 2016-11-24 NOTE — ED Notes (Signed)
Patient transported to CT 

## 2016-11-24 NOTE — ED Notes (Signed)
Pt gave verbal consent for E- Signature at discharge.

## 2017-02-02 ENCOUNTER — Inpatient Hospital Stay
Admission: EM | Admit: 2017-02-02 | Discharge: 2017-02-05 | DRG: 291 | Disposition: A | Discharge status: Home Health | Payer: Medicare Other | Attending: Specialist | Admitting: Specialist

## 2017-02-02 ENCOUNTER — Encounter: Payer: Self-pay | Admitting: *Deleted

## 2017-02-02 ENCOUNTER — Emergency Department: Payer: Medicare Other

## 2017-02-02 DIAGNOSIS — I252 Old myocardial infarction: Secondary | ICD-10-CM

## 2017-02-02 DIAGNOSIS — Z87891 Personal history of nicotine dependence: Secondary | ICD-10-CM

## 2017-02-02 DIAGNOSIS — I5043 Acute on chronic combined systolic (congestive) and diastolic (congestive) heart failure: Secondary | ICD-10-CM | POA: Diagnosis present

## 2017-02-02 DIAGNOSIS — J690 Pneumonitis due to inhalation of food and vomit: Secondary | ICD-10-CM | POA: Diagnosis present

## 2017-02-02 DIAGNOSIS — L940 Localized scleroderma [morphea]: Secondary | ICD-10-CM | POA: Diagnosis present

## 2017-02-02 DIAGNOSIS — E1140 Type 2 diabetes mellitus with diabetic neuropathy, unspecified: Secondary | ICD-10-CM | POA: Diagnosis present

## 2017-02-02 DIAGNOSIS — Z23 Encounter for immunization: Secondary | ICD-10-CM

## 2017-02-02 DIAGNOSIS — Z7984 Long term (current) use of oral hypoglycemic drugs: Secondary | ICD-10-CM

## 2017-02-02 DIAGNOSIS — J810 Acute pulmonary edema: Secondary | ICD-10-CM | POA: Diagnosis not present

## 2017-02-02 DIAGNOSIS — I130 Hypertensive heart and chronic kidney disease with heart failure and stage 1 through stage 4 chronic kidney disease, or unspecified chronic kidney disease: Secondary | ICD-10-CM | POA: Diagnosis not present

## 2017-02-02 DIAGNOSIS — N179 Acute kidney failure, unspecified: Secondary | ICD-10-CM | POA: Diagnosis present

## 2017-02-02 DIAGNOSIS — E1122 Type 2 diabetes mellitus with diabetic chronic kidney disease: Secondary | ICD-10-CM | POA: Diagnosis present

## 2017-02-02 DIAGNOSIS — I509 Heart failure, unspecified: Secondary | ICD-10-CM

## 2017-02-02 DIAGNOSIS — R0902 Hypoxemia: Secondary | ICD-10-CM

## 2017-02-02 DIAGNOSIS — J9621 Acute and chronic respiratory failure with hypoxia: Secondary | ICD-10-CM | POA: Diagnosis present

## 2017-02-02 DIAGNOSIS — I69351 Hemiplegia and hemiparesis following cerebral infarction affecting right dominant side: Secondary | ICD-10-CM

## 2017-02-02 DIAGNOSIS — N182 Chronic kidney disease, stage 2 (mild): Secondary | ICD-10-CM | POA: Diagnosis present

## 2017-02-02 DIAGNOSIS — I69320 Aphasia following cerebral infarction: Secondary | ICD-10-CM

## 2017-02-02 DIAGNOSIS — E119 Type 2 diabetes mellitus without complications: Secondary | ICD-10-CM

## 2017-02-02 DIAGNOSIS — Z7902 Long term (current) use of antithrombotics/antiplatelets: Secondary | ICD-10-CM

## 2017-02-02 DIAGNOSIS — D638 Anemia in other chronic diseases classified elsewhere: Secondary | ICD-10-CM | POA: Diagnosis present

## 2017-02-02 DIAGNOSIS — I5023 Acute on chronic systolic (congestive) heart failure: Secondary | ICD-10-CM | POA: Diagnosis present

## 2017-02-02 DIAGNOSIS — Z79899 Other long term (current) drug therapy: Secondary | ICD-10-CM

## 2017-02-02 DIAGNOSIS — Z8249 Family history of ischemic heart disease and other diseases of the circulatory system: Secondary | ICD-10-CM

## 2017-02-02 DIAGNOSIS — Z818 Family history of other mental and behavioral disorders: Secondary | ICD-10-CM

## 2017-02-02 DIAGNOSIS — J90 Pleural effusion, not elsewhere classified: Secondary | ICD-10-CM | POA: Diagnosis present

## 2017-02-02 LAB — CBC
HEMATOCRIT: 30.6 % — AB (ref 40.0–52.0)
HEMOGLOBIN: 9.7 g/dL — AB (ref 13.0–18.0)
MCH: 25.3 pg — AB (ref 26.0–34.0)
MCHC: 31.7 g/dL — AB (ref 32.0–36.0)
MCV: 79.9 fL — AB (ref 80.0–100.0)
Platelets: 354 10*3/uL (ref 150–440)
RBC: 3.83 MIL/uL — ABNORMAL LOW (ref 4.40–5.90)
RDW: 16.9 % — ABNORMAL HIGH (ref 11.5–14.5)
WBC: 9.6 10*3/uL (ref 3.8–10.6)

## 2017-02-02 NOTE — ED Triage Notes (Signed)
Pt brought in [facility] from [facility]. Pt ate and then choked on food.  Pt alert on arrival  md at bedside.

## 2017-02-02 NOTE — ED Notes (Signed)
Pt brought in [facility] from [facility] with diff breathing after choking on food tonight.  Hx cva.  md at bedside.   Pt placed on 2 liters oxygen with low sats on room air.  Pt alert.

## 2017-02-02 NOTE — ED Provider Notes (Signed)
[facility] Emergency Department Provider Note   ____________________________________________   First MD Initiated Contact with Patient 02/02/17 2336     (approximate)  I have reviewed the triage vital signs and the nursing notes.   HISTORY  Chief Complaint Respiratory Distress    HPI Malik Bruce is a 66 y.o. male who [facility]. He was sent for breathing difficulties. EMS states that his O2 sats are 85% on room air. EMS states that the patient was left in his room eating on his own. They were concerned that he might have choked and could've aspirated. The patient said he did become a little chilled. His shortness of breath started this evening. The patient states that he's had a mild cough with no productive him. The patient denies any chest pains or shortness of breath. He states that he does feel a little dizzy but the difficulty breathing to started up nowhere. The patient denies any nausea or vomiting as well. The patient does have some difficulty speaking but he does have a history of a stroke. The patient does take Lasix so there is a question of if he has a history of congestive heart failure.The patient was brought in for evaluation.   Past Medical History:  Diagnosis Date  . CVA (cerebral vascular accident) (HCC)   . DM type 2 (diabetes mellitus, type 2) (HCC)   . HTN (hypertension)     Patient Active Problem List   Diagnosis Date Noted  . CVA (cerebral vascular accident) (HCC) 08/06/2016  . Acute metabolic encephalopathy 08/06/2016  . Leukocytosis 08/06/2016  . Anemia due to chronic kidney disease 08/06/2016  . Renal insufficiency 08/06/2016  . Hyperlipidemia 08/06/2016  . CVA, old, hemiparesis (HCC) 08/06/2016    History reviewed. No pertinent surgical history.  Prior to Admission medications   Medication Sig Start Date End Date Taking? Authorizing Provider  acetaminophen (TYLENOL) 500 MG tablet Take 500  mg by mouth every 4 (four) hours as needed for mild pain, fever or headache.    Yes [provider]  acetaminophen (TYLENOL) 500 MG tablet Take 1,000 mg by mouth 2 (two) times daily.   Yes [provider]  alum & mag hydroxide-simeth (MINTOX) 200-200-20 MG/5ML suspension Take 30 mLs by mouth as needed for indigestion or heartburn (Do not exceed 4 doses in 24 hours).   Yes [provider]  atorvastatin (LIPITOR) 40 MG tablet Take 40 mg by mouth daily.   Yes [provider]  b complex vitamins tablet Take 1 tablet by mouth daily.   Yes [provider]  baclofen (LIORESAL) 10 MG tablet Take 5 mg by mouth 3 (three) times daily.    Yes [provider]  cefUROXime (CEFTIN) 500 MG tablet Take 500 mg by mouth 2 (two) times daily with a meal.   Yes [provider]  clopidogrel (PLAVIX) 75 MG tablet Take 1 tablet (75 mg total) by mouth daily. 08/11/16  Yes Penny Pia, MD  cyanocobalamin (,VITAMIN B-12,) 1000 MCG/ML injection Inject 1,000 mcg into the muscle every 30 (thirty) days. On the 11th of every month   Yes [provider]  ferrous sulfate 325 (65 FE) MG tablet Take 325 mg by mouth 2 (two) times daily.   Yes [provider]  fluticasone (FLONASE) 50 MCG/ACT nasal spray Place 1 spray into both nostrils daily.   Yes [provider]  furosemide (LASIX) 20 MG tablet Take 30 mg by mouth daily.  02/03/17 02/07/17 Yes [provider]  guaifenesin (ROBAFEN) 100 MG/5ML syrup Take 200 mg by mouth every 6 (six) hours as needed for cough (Do not exceed 4 doses in 24 hours).   Yes [provider]  liver oil-zinc oxide (DESITIN) 40 % ointment Apply 1 application topically as needed for irritation.   Yes [provider]  liver oil-zinc oxide (DESITIN) 40 % ointment Apply 1 application topically 2 (two) times daily.   Yes [provider]  Melatonin 3 MG TABS Take 3 mg by mouth at bedtime.   Yes  [provider]  metFORMIN (GLUCOPHAGE) 500 MG tablet Take 1,000 mg by mouth 2 (two) times daily with a meal.   Yes [provider]  metoprolol tartrate (LOPRESSOR) 25 MG tablet Take 25 mg by mouth daily.   Yes [provider]  pregabalin (LYRICA) 50 MG capsule Take 50 mg by mouth 3 (three) times daily.   Yes [provider]    Allergies Patient has no known allergies.  No family history on file.  Social History Social History  Substance Use Topics  . Smoking status: Former Games developermoker  . Smokeless tobacco: Former NeurosurgeonUser  . Alcohol use No    Review of Systems  Constitutional: No fever/chills Eyes: No visual changes. ENT: No sore throat. Cardiovascular: Denies chest pain. Respiratory:  shortness of breath. Gastrointestinal: No abdominal pain.  No nausea, no vomiting.  No diarrhea.  No constipation. Genitourinary: Negative for dysuria. Musculoskeletal: Negative for back pain. Skin: Negative for rash. Neurological: dizziness   ____________________________________________   PHYSICAL EXAM:  VITAL SIGNS: ED Triage Vitals  Enc Vitals Group     BP 02/02/17 2339 (!) 162/92     Pulse Rate 02/02/17 2339 95     Resp 02/02/17 2339 (!) 22     Temp --      Temp src --      SpO2 02/02/17 2339 (!) 86 %     Weight 02/02/17 2340 170 lb (77.1 kg)     Height 02/02/17 2340 5\' 10"  (1.778 m)     Head Circumference --      Peak Flow --      Pain Score --      Pain Loc --      Pain Edu? --      Excl. in GC? --     Constitutional: Alert and oriented. Well appearing and in mild distress. Eyes: Conjunctivae are normal. PERRL. EOMI. Head: Atraumatic. Nose: No congestion/rhinnorhea. Mouth/Throat: Mucous membranes are moist.  Oropharynx non-erythematous. Cardiovascular: Normal rate, regular rhythm. Grossly normal heart sounds.  Good peripheral circulation. Respiratory: Normal respiratory effort.  No retractions. crackles in bilateral  bases Gastrointestinal: Soft and nontender. No distention. positive bowel sounds Musculoskeletal: No lower extremity tenderness nor edema.   Neurologic:  Normal speech and language.  Skin:  Skin is warm, dry and intact.  Psychiatric: Mood and affect are normal.   ____________________________________________   LABS (all labs ordered are listed, but only abnormal results are displayed)  Labs Reviewed  BASIC METABOLIC PANEL - Abnormal; Notable for the following:       Result Value   Glucose, Bld 121 (*)    BUN 31 (*)    Creatinine, Ser 1.62 (*)    Calcium 8.6 (*)    GFR calc non Af Amer 42 (*)    GFR calc Af Amer 49 (*)    All other components within normal limits  CBC - Abnormal; Notable for the following:  RBC 3.83 (*)    Hemoglobin 9.7 (*)    HCT 30.6 (*)    MCV 79.9 (*)    MCH 25.3 (*)    MCHC 31.7 (*)    RDW 16.9 (*)    All other components within normal limits  BRAIN NATRIURETIC PEPTIDE - Abnormal; Notable for the following:    B Natriuretic Peptide 571.0 (*)    All other components within normal limits  TROPONIN I   ____________________________________________  EKG  ED ECG REPORT I, Rebecka Apley, the attending physician, personally viewed and interpreted this ECG.   Date: 02/02/2017  EKG Time: 2348  Rate: 93  Rhythm: normal sinus rhythm  Axis: normal  Intervals: prolonged qtc  ST&T Change: diffuse t wave flattening  ____________________________________________  RADIOLOGY  Dg Chest Port 1 View  Result Date: 02/03/2017 CLINICAL DATA:  66 year old male with shortness of breath. EXAM: PORTABLE CHEST 1 VIEW COMPARISON:  Chest radiograph dated 11/24/2016 FINDINGS: There is mild cardiomegaly. There is diffuse interstitial and vascular prominence consistent with vascular congestion. Superimposed pneumonia is not excluded. Probable bilateral pleural effusions. No pneumothorax. There is osteopenia with degenerative changes of the spine and shoulders. No  acute osseous pathology. IMPRESSION: Mild cardiomegaly with findings of CHF. Superimposed pneumonia is not excluded. Clinical correlation is recommended. Probable small bilateral pleural effusions. Electronically Signed   By: Elgie Collard M.D.   On: 02/03/2017 00:14    ____________________________________________   PROCEDURES  Procedure(s) performed: None  Procedures  Critical Care performed: No  ____________________________________________   INITIAL IMPRESSION / ASSESSMENT AND PLAN / ED COURSE  As part of my medical decision making, I reviewed the following data within the electronic MEDICAL RECORD NUMBER Notes from prior ED visits and [place] Controlled Substance Database   This is a 66 year old male who comes into the hospital today with some shortness of breath. The patient does have some crackles on his exam.  My differential diagnosis includes aspiration pneumonia, congestive heart failure exacerbation, community acquired pneumonia  I did perform a chest x-ray and the patient does look like he has some pulmonary edema. I will give the patient a dose of Lasix as I await the results of his blood work. The patient was placed on oxygen as his oxygen saturation was 86 on his arrival. He will be reassessed.     the patient's chest x-ray was significant for some moderate cardiomegaly with findings of CHF. They state that a superimposed pneumonia is not excluded although the patient has not had any signs of pneumonia. The patient's BMP returned at 571 but again he is hypoxic with some pulmonary edema on chest x-ray. I will admit the patient to the hospitalist service.  ____________________________________________   FINAL CLINICAL IMPRESSION(S) / ED DIAGNOSES  Final diagnoses:  Acute pulmonary edema (HCC)  Hypoxia  Acute on chronic congestive heart failure, unspecified heart failure type (HCC)      NEW MEDICATIONS STARTED DURING THIS VISIT:  New Prescriptions   No medications  on file     Note:  This document was prepared using Dragon voice recognition software and may include unintentional dictation errors.    Rebecka Apley, MD 02/03/17 [phone]

## 2017-02-03 ENCOUNTER — Encounter: Payer: Self-pay | Admitting: Emergency Medicine

## 2017-02-03 DIAGNOSIS — I69351 Hemiplegia and hemiparesis following cerebral infarction affecting right dominant side: Secondary | ICD-10-CM | POA: Diagnosis not present

## 2017-02-03 DIAGNOSIS — Z23 Encounter for immunization: Secondary | ICD-10-CM | POA: Diagnosis present

## 2017-02-03 DIAGNOSIS — D638 Anemia in other chronic diseases classified elsewhere: Secondary | ICD-10-CM | POA: Diagnosis present

## 2017-02-03 DIAGNOSIS — L940 Localized scleroderma [morphea]: Secondary | ICD-10-CM | POA: Diagnosis present

## 2017-02-03 DIAGNOSIS — E1122 Type 2 diabetes mellitus with diabetic chronic kidney disease: Secondary | ICD-10-CM | POA: Diagnosis present

## 2017-02-03 DIAGNOSIS — I6930 Unspecified sequelae of cerebral infarction: Secondary | ICD-10-CM

## 2017-02-03 DIAGNOSIS — D649 Anemia, unspecified: Secondary | ICD-10-CM

## 2017-02-03 DIAGNOSIS — J810 Acute pulmonary edema: Secondary | ICD-10-CM | POA: Diagnosis present

## 2017-02-03 DIAGNOSIS — I69320 Aphasia following cerebral infarction: Secondary | ICD-10-CM | POA: Diagnosis not present

## 2017-02-03 DIAGNOSIS — Z7902 Long term (current) use of antithrombotics/antiplatelets: Secondary | ICD-10-CM | POA: Diagnosis not present

## 2017-02-03 DIAGNOSIS — N179 Acute kidney failure, unspecified: Secondary | ICD-10-CM | POA: Diagnosis present

## 2017-02-03 DIAGNOSIS — I252 Old myocardial infarction: Secondary | ICD-10-CM | POA: Diagnosis not present

## 2017-02-03 DIAGNOSIS — I509 Heart failure, unspecified: Secondary | ICD-10-CM | POA: Diagnosis not present

## 2017-02-03 DIAGNOSIS — J9621 Acute and chronic respiratory failure with hypoxia: Secondary | ICD-10-CM | POA: Diagnosis present

## 2017-02-03 DIAGNOSIS — Z79899 Other long term (current) drug therapy: Secondary | ICD-10-CM | POA: Diagnosis not present

## 2017-02-03 DIAGNOSIS — Z7984 Long term (current) use of oral hypoglycemic drugs: Secondary | ICD-10-CM | POA: Diagnosis not present

## 2017-02-03 DIAGNOSIS — N182 Chronic kidney disease, stage 2 (mild): Secondary | ICD-10-CM | POA: Diagnosis present

## 2017-02-03 DIAGNOSIS — E1140 Type 2 diabetes mellitus with diabetic neuropathy, unspecified: Secondary | ICD-10-CM | POA: Diagnosis present

## 2017-02-03 DIAGNOSIS — J069 Acute upper respiratory infection, unspecified: Secondary | ICD-10-CM

## 2017-02-03 DIAGNOSIS — Z87891 Personal history of nicotine dependence: Secondary | ICD-10-CM | POA: Diagnosis not present

## 2017-02-03 DIAGNOSIS — B9789 Other viral agents as the cause of diseases classified elsewhere: Secondary | ICD-10-CM

## 2017-02-03 DIAGNOSIS — Z8249 Family history of ischemic heart disease and other diseases of the circulatory system: Secondary | ICD-10-CM | POA: Diagnosis not present

## 2017-02-03 DIAGNOSIS — J690 Pneumonitis due to inhalation of food and vomit: Secondary | ICD-10-CM | POA: Diagnosis present

## 2017-02-03 DIAGNOSIS — J90 Pleural effusion, not elsewhere classified: Secondary | ICD-10-CM | POA: Diagnosis present

## 2017-02-03 DIAGNOSIS — I5043 Acute on chronic combined systolic (congestive) and diastolic (congestive) heart failure: Secondary | ICD-10-CM | POA: Diagnosis present

## 2017-02-03 DIAGNOSIS — Z818 Family history of other mental and behavioral disorders: Secondary | ICD-10-CM | POA: Diagnosis not present

## 2017-02-03 DIAGNOSIS — I5023 Acute on chronic systolic (congestive) heart failure: Secondary | ICD-10-CM | POA: Diagnosis present

## 2017-02-03 DIAGNOSIS — I130 Hypertensive heart and chronic kidney disease with heart failure and stage 1 through stage 4 chronic kidney disease, or unspecified chronic kidney disease: Secondary | ICD-10-CM | POA: Diagnosis present

## 2017-02-03 DIAGNOSIS — R0902 Hypoxemia: Secondary | ICD-10-CM | POA: Diagnosis not present

## 2017-02-03 LAB — CBC
HCT: 30.6 % — ABNORMAL LOW (ref 40.0–52.0)
HEMOGLOBIN: 9.6 g/dL — AB (ref 13.0–18.0)
MCH: 24.9 pg — AB (ref 26.0–34.0)
MCHC: 31.5 g/dL — AB (ref 32.0–36.0)
MCV: 79.2 fL — ABNORMAL LOW (ref 80.0–100.0)
PLATELETS: 325 10*3/uL (ref 150–440)
RBC: 3.86 MIL/uL — ABNORMAL LOW (ref 4.40–5.90)
RDW: 17.1 % — ABNORMAL HIGH (ref 11.5–14.5)
WBC: 8.1 10*3/uL (ref 3.8–10.6)

## 2017-02-03 LAB — BASIC METABOLIC PANEL
Anion gap: 7 (ref 5–15)
Anion gap: 9 (ref 5–15)
BUN: 31 mg/dL — AB (ref 6–20)
BUN: 34 mg/dL — ABNORMAL HIGH (ref 6–20)
CALCIUM: 8.6 mg/dL — AB (ref 8.9–10.3)
CHLORIDE: 105 mmol/L (ref 101–111)
CO2: 23 mmol/L (ref 22–32)
CO2: 24 mmol/L (ref 22–32)
CREATININE: 1.58 mg/dL — AB (ref 0.61–1.24)
CREATININE: 1.62 mg/dL — AB (ref 0.61–1.24)
Calcium: 8.6 mg/dL — ABNORMAL LOW (ref 8.9–10.3)
Chloride: 106 mmol/L (ref 101–111)
GFR calc Af Amer: 49 mL/min — ABNORMAL LOW (ref >60)
GFR calc Af Amer: 50 mL/min — ABNORMAL LOW (ref >60)
GFR calc non Af Amer: 42 mL/min — ABNORMAL LOW (ref >60)
GFR calc non Af Amer: 43 mL/min — ABNORMAL LOW (ref >60)
GLUCOSE: 112 mg/dL — AB (ref 65–99)
Glucose, Bld: 121 mg/dL — ABNORMAL HIGH (ref 65–99)
POTASSIUM: 4.7 mmol/L (ref 3.5–5.1)
Potassium: 4.3 mmol/L (ref 3.5–5.1)
Sodium: 137 mmol/L (ref 135–145)
Sodium: 137 mmol/L (ref 135–145)

## 2017-02-03 LAB — TSH: TSH: 2.747 u[IU]/mL (ref 0.350–4.500)

## 2017-02-03 LAB — HEMOGLOBIN A1C
Hgb A1c MFr Bld: 5.6 % (ref 4.8–5.6)
Mean Plasma Glucose: 114.02 mg/dL

## 2017-02-03 LAB — MRSA PCR SCREENING: MRSA by PCR: POSITIVE — AB

## 2017-02-03 LAB — GLUCOSE, CAPILLARY
Glucose-Capillary: 130 mg/dL — ABNORMAL HIGH (ref 65–99)
Glucose-Capillary: 114 mg/dL — ABNORMAL HIGH (ref 65–99)
Glucose-Capillary: 83 mg/dL (ref 65–99)
Glucose-Capillary: 125 mg/dL — ABNORMAL HIGH (ref 65–99)

## 2017-02-03 LAB — TROPONIN I: Troponin I: <0.03 ng/mL (ref <0.03)

## 2017-02-03 LAB — BRAIN NATRIURETIC PEPTIDE: B Natriuretic Peptide: 571 pg/mL — ABNORMAL HIGH (ref 0.0–100.0)

## 2017-02-03 MED ORDER — CYANOCOBALAMIN 1000 MCG/ML IJ SOLN: 1000.0000 ug | INTRAMUSCULAR | Status: DC

## 2017-02-03 MED ORDER — ATORVASTATIN CALCIUM 20 MG PO TABS
40.0000 mg | ORAL_TABLET | Freq: Every day | ORAL | Status: DC
  Administered 2017-02-03 – 2017-02-05 (×2): 40 mg via ORAL
  Filled 2017-02-03 (×3): qty 2

## 2017-02-03 MED ORDER — ZINC OXIDE 40 % EX OINT
1.0000 "application " | TOPICAL_OINTMENT | CUTANEOUS | Status: DC | PRN
  Filled 2017-02-03: qty 114

## 2017-02-03 MED ORDER — INSULIN ASPART 100 UNIT/ML ~~LOC~~ SOLN: 0.0000 [IU] | Freq: Every day | SUBCUTANEOUS | Status: DC

## 2017-02-03 MED ORDER — FUROSEMIDE 10 MG/ML IJ SOLN
40.0000 mg | Freq: Once | INTRAMUSCULAR | Status: AC
  Administered 2017-02-03: 40 mg via INTRAVENOUS
  Filled 2017-02-03: qty 4

## 2017-02-03 MED ORDER — ACETAMINOPHEN 325 MG PO TABS: 650.0000 mg | ORAL_TABLET | Freq: Four times a day (QID) | ORAL | Status: DC | PRN

## 2017-02-03 MED ORDER — ACETAMINOPHEN 650 MG RE SUPP: 650.0000 mg | Freq: Four times a day (QID) | RECTAL | Status: DC | PRN

## 2017-02-03 MED ORDER — HEPARIN SODIUM (PORCINE) 5000 UNIT/ML IJ SOLN
5000.0000 [IU] | Freq: Three times a day (TID) | INTRAMUSCULAR | Status: DC
  Administered 2017-02-03 – 2017-02-04 (×4): 5000 [IU] via SUBCUTANEOUS
  Filled 2017-02-03 (×4): qty 1

## 2017-02-03 MED ORDER — PNEUMOCOCCAL VAC POLYVALENT 25 MCG/0.5ML IJ INJ
0.5000 mL | INJECTION | INTRAMUSCULAR | Status: AC
  Administered 2017-02-05: 0.5 mL via INTRAMUSCULAR
  Filled 2017-02-03: qty 0.5

## 2017-02-03 MED ORDER — INSULIN ASPART 100 UNIT/ML ~~LOC~~ SOLN
0.0000 [IU] | Freq: Three times a day (TID) | SUBCUTANEOUS | Status: DC
  Administered 2017-02-03: 1 [IU] via SUBCUTANEOUS
  Filled 2017-02-03: qty 1

## 2017-02-03 MED ORDER — B COMPLEX PO TABS: 1.0000 | ORAL_TABLET | Freq: Every day | ORAL | Status: DC

## 2017-02-03 MED ORDER — PIPERACILLIN-TAZOBACTAM 3.375 G IVPB
3.3750 g | Freq: Three times a day (TID) | INTRAVENOUS | Status: DC
  Administered 2017-02-03 – 2017-02-04 (×4): 3.375 g via INTRAVENOUS
  Filled 2017-02-03 (×4): qty 50

## 2017-02-03 MED ORDER — B COMPLEX-C PO TABS
1.0000 | ORAL_TABLET | Freq: Every day | ORAL | Status: DC
  Administered 2017-02-03 – 2017-02-05 (×3): 1 via ORAL
  Filled 2017-02-03 (×3): qty 1

## 2017-02-03 MED ORDER — INFLUENZA VAC SPLIT HIGH-DOSE 0.5 ML IM SUSY
0.5000 mL | PREFILLED_SYRINGE | INTRAMUSCULAR | Status: DC
  Filled 2017-02-03: qty 0.5

## 2017-02-03 MED ORDER — CEFUROXIME AXETIL 500 MG PO TABS
500.0000 mg | ORAL_TABLET | Freq: Two times a day (BID) | ORAL | Status: DC
  Filled 2017-02-03: qty 1

## 2017-02-03 MED ORDER — MELATONIN 5 MG PO TABS
5.0000 mg | ORAL_TABLET | Freq: Every day | ORAL | Status: DC
  Administered 2017-02-03 – 2017-02-04 (×2): 5 mg via ORAL
  Filled 2017-02-03 (×3): qty 1

## 2017-02-03 MED ORDER — ONDANSETRON HCL 4 MG PO TABS: 4.0000 mg | ORAL_TABLET | Freq: Four times a day (QID) | ORAL | Status: DC | PRN

## 2017-02-03 MED ORDER — DOCUSATE SODIUM 100 MG PO CAPS
100.0000 mg | ORAL_CAPSULE | Freq: Two times a day (BID) | ORAL | Status: DC
  Administered 2017-02-03 – 2017-02-05 (×4): 100 mg via ORAL
  Filled 2017-02-03 (×5): qty 1

## 2017-02-03 MED ORDER — FLUTICASONE PROPIONATE 50 MCG/ACT NA SUSP
1.0000 | Freq: Every day | NASAL | Status: DC
  Administered 2017-02-03 – 2017-02-05 (×2): 1 via NASAL
  Filled 2017-02-03: qty 16

## 2017-02-03 MED ORDER — ALUM & MAG HYDROXIDE-SIMETH 200-200-20 MG/5ML PO SUSP: 30.0000 mL | ORAL | Status: DC | PRN

## 2017-02-03 MED ORDER — FERROUS SULFATE 325 (65 FE) MG PO TABS
325.0000 mg | ORAL_TABLET | Freq: Two times a day (BID) | ORAL | Status: DC
  Administered 2017-02-03 – 2017-02-05 (×5): 325 mg via ORAL
  Filled 2017-02-03 (×5): qty 1

## 2017-02-03 MED ORDER — CLOPIDOGREL BISULFATE 75 MG PO TABS
75.0000 mg | ORAL_TABLET | Freq: Every day | ORAL | Status: DC
  Administered 2017-02-03 – 2017-02-05 (×3): 75 mg via ORAL
  Filled 2017-02-03 (×3): qty 1

## 2017-02-03 MED ORDER — ONDANSETRON HCL 4 MG/2ML IJ SOLN: 4.0000 mg | Freq: Four times a day (QID) | INTRAMUSCULAR | Status: DC | PRN

## 2017-02-03 MED ORDER — BACLOFEN 5 MG HALF TABLET
5.0000 mg | ORAL_TABLET | Freq: Three times a day (TID) | ORAL | Status: DC
  Administered 2017-02-03 – 2017-02-05 (×8): 5 mg via ORAL
  Filled 2017-02-03 (×10): qty 1

## 2017-02-03 MED ORDER — GUAIFENESIN 100 MG/5ML PO SYRP
200.0000 mg | ORAL_SOLUTION | Freq: Four times a day (QID) | ORAL | Status: DC | PRN
  Filled 2017-02-03: qty 10

## 2017-02-03 MED ORDER — PREGABALIN 50 MG PO CAPS
50.0000 mg | ORAL_CAPSULE | Freq: Three times a day (TID) | ORAL | Status: DC
  Administered 2017-02-03 – 2017-02-05 (×8): 50 mg via ORAL
  Filled 2017-02-03 (×8): qty 1

## 2017-02-03 MED ORDER — METOPROLOL TARTRATE 25 MG PO TABS
25.0000 mg | ORAL_TABLET | Freq: Every day | ORAL | Status: DC
  Administered 2017-02-03 – 2017-02-05 (×3): 25 mg via ORAL
  Filled 2017-02-03 (×3): qty 1

## 2017-02-03 NOTE — Clinical Social Work Note (Signed)
Clinical Social Work Assessment  Patient Details  Name: Malik Bruce MRN: [id number] Date of Birth: [birth year]  Date of referral:  02/03/17               Reason for consult:  Other (Comment Required) (From [facility] )                Permission sought to share information with:  Facility Art therapist granted to share information::  Yes, Verbal Permission Granted  Name::      [facility]   Agency::     Relationship::     Contact Information:     Housing/Transportation Living arrangements for the past 2 months:  [place] of Information:  Patient, Facility, Other (Comment Required) ([employer] developer ) Patient Interpreter Needed:  None Criminal Activity/Legal Involvement Pertinent to Current Situation/Hospitalization:  No - Comment as needed Significant Relationships:  Siblings Lives with:  Facility Resident Do you feel safe going back to the place where you live?  Yes Need for family participation in patient care:  Yes (Comment)  Care giving concerns:  Patient is a resident at [facility] (fax: [phone]).    Social Worker assessment / plan:  Holiday representative (CSW) reviewed chart and noted that patient is from [facility]. CSW met with patient alone at bedside to discuss D/C plan. Patient was oriented to self and place however his communication was limited. Patient did confirm that he is from [facility] and will return there. CSW contacted [facility] administrator to get additional information. Per Horris Latino patient has been at [facility] for 3 months and uses a wheel chair only. Per Horris Latino patient doesn't walk or transfer himself they use a sit to stand lift. Per Horris Latino patient is on room air at baseline and can return to [place] when stable. CSW contacted patient's brother/ HPOA Coralyn Mark and made him aware of above. Brother is agreeable for patient to return to [facility]. CSW will continue to follow and assist as  needed.   Employment status:  Disabled (Comment on whether or not currently receiving Disability), Retired Forensic scientist:  Medicare PT Recommendations:  Not assessed at this time Information / Referral to community resources:  Other (Comment Required) (Patient will return to [facility] )  Patient/Family's Response to care:  Patient and his brother are agreeable for patient to return to [facility].   Patient/Family's Understanding of and Emotional Response to Diagnosis, Current Treatment, and Prognosis:  Patient and his brother were very pleasant and thanked CSW for assistance.   Emotional Assessment Appearance:  Appears stated age Attitude/Demeanor/Rapport:    Affect (typically observed):  Pleasant Orientation:  Oriented to Self, Oriented to Situation, Oriented to Place, Fluctuating Orientation (Suspected and/or reported Sundowners) Alcohol / Substance use:  Not Applicable Psych involvement (Current and /or in the community):  No (Comment)  Discharge Needs  Concerns to be addressed:  Discharge Planning Concerns Readmission within the last 30 days:  No Current discharge risk:  Dependent with Mobility, Chronically ill Barriers to Discharge:  Continued Medical Work up   [employer], Veronia Beets, LCSW 02/03/2017, 1:12 PM

## 2017-02-03 NOTE — Consult Note (Signed)
Cardiology Consultation:   Patient ID: Malik JanskyJames A Fees; [id number]; Apr 03, 1951   Admit date: 02/02/2017 Date of Consult: 02/03/2017  Primary Care Provider: Patient, No Pcp Per Primary Cardiologist: [facility] to [facility] - consult by Gollan   Patient Profile:   Malik Bruce is a 66 y.o. 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 Sheryle Hailiamond, MD.  History of Present Illness:   Mr. Malik Bruce [facility] for CVA. Echo at that time showed normal EF with normal diastolic function parameters and normal PASP. He was started on Plavix and continued on statin per neurology. He does not have any previously known cardiac history.   [facility] from his rehab facility for increased SOB x 1 month with associated hypoxia of 85% on room air on 10/23. He is minimally conversive and does not know if there has been any significant weight gain recently, lower extremity swelling, abdominal distension, or orthopnea. He denies any cough or chest pain. There was concern for possible choking and aspiration prompting his admission.   Upon the [facility] they were found to have BP 162/92 that has since improved to 136/82, HR 92 bpm, temp 98.8, oxygen saturation 82% on room air  Improved on supplemental oxygen via nasal cannula, weight 174 pounds. EKG showed NSR, 93 bpm, poor quality study with baseline artifact, prior inferolateral infarct, nonspecific st/t changes, CXR showed mild CM with possible superimposed PNA and possible small bilateral pleural effusions. Labs showed BNP 571, troponin negative x 1, SCr 1.62 (baseline ~ 1.1-1.4), BUN 31, K+ 4.7, WBC 9.6, HGB 9.7 (baseline ~ 9-10), PLT 354, TSH normal. He was given IV Lasix 40 mg x 2 with a documented UOP of 160 mL  for the admission to date  Past Medical History:  Diagnosis Date  . CVA (cerebral vascular accident) (HCC)   . DM type 2 (diabetes mellitus, type 2) (HCC)   . HTN (hypertension)     History reviewed. No pertinent surgical history.   Home Meds: Prior to Admission medications   Medication Sig Start Date End Date Taking? Authorizing Provider  acetaminophen (TYLENOL) 500 MG tablet Take 500 mg by mouth every 4 (four) hours as needed for mild pain, fever or headache.    Yes [provider]  acetaminophen (TYLENOL) 500 MG tablet Take 1,000 mg by mouth 2 (two) times daily.   Yes [provider]  alum & mag hydroxide-simeth (MINTOX) 200-200-20 MG/5ML suspension Take 30 mLs by mouth as needed for indigestion or heartburn (Do not exceed 4 doses in 24 hours).   Yes [provider]  atorvastatin (LIPITOR) 40 MG tablet Take 40 mg by mouth daily.   Yes [provider]  b complex vitamins tablet Take 1 tablet by mouth daily.   Yes [provider]  baclofen (LIORESAL) 10 MG tablet Take 5 mg by mouth 3 (three) times daily.    Yes [provider]  cefUROXime (CEFTIN) 500 MG tablet Take 500 mg by mouth 2 (two) times daily with a meal.   Yes [provider]  clopidogrel (PLAVIX) 75 MG tablet Take 1 tablet (75 mg total) by mouth daily. 08/11/16  Yes Penny PiaVega, [place], MD  cyanocobalamin (,VITAMIN B-12,) 1000 MCG/ML injection Inject  1,000 mcg into the muscle every 30 (thirty) days. On the 11th of every month   Yes [provider]  ferrous sulfate 325 (65 FE) MG tablet Take 325 mg by mouth 2 (two) times daily.   Yes [provider]  fluticasone (FLONASE) 50 MCG/ACT nasal spray Place 1 spray into both nostrils daily.   Yes [provider]  furosemide (LASIX) 20 MG tablet Take 30 mg by mouth daily. 02/03/17 02/07/17 Yes [provider]  guaifenesin (ROBAFEN) 100 MG/5ML syrup Take 200 mg by mouth every 6 (six) hours as needed  for cough (Do not exceed 4 doses in 24 hours).   Yes [provider]  liver oil-zinc oxide (DESITIN) 40 % ointment Apply 1 application topically as needed for irritation.   Yes [provider]  liver oil-zinc oxide (DESITIN) 40 % ointment Apply 1 application topically 2 (two) times daily.   Yes [provider]  Melatonin 3 MG TABS Take 3 mg by mouth at bedtime.   Yes [provider]  metFORMIN (GLUCOPHAGE) 500 MG tablet Take 1,000 mg by mouth 2 (two) times daily with a meal.   Yes [provider]  metoprolol tartrate (LOPRESSOR) 25 MG tablet Take 25 mg by mouth daily.   Yes [provider]  pregabalin (LYRICA) 50 MG capsule Take 50 mg by mouth 3 (three) times daily.   Yes [provider]    Inpatient Medications: Scheduled Meds: . atorvastatin  40 mg Oral Daily  . B-complex with vitamin C  1 tablet Oral Daily  . baclofen  5 mg Oral TID  . clopidogrel  75 mg Oral Daily  . [START ON 02/21/2017] cyanocobalamin  1,000 mcg Intramuscular Q30 days  . docusate sodium  100 mg Oral BID  . ferrous sulfate  325 mg Oral BID  . fluticasone  1 spray Each Nare Daily  . heparin  5,000 Units Subcutaneous Q8H  . [START ON 02/04/2017] Influenza vac split quadrivalent PF  0.5 mL Intramuscular Tomorrow-1000  . insulin aspart  0-5 Units Subcutaneous QHS  . insulin aspart  0-9 Units Subcutaneous TID WC  . Melatonin  5 mg Oral QHS  . metoprolol tartrate  25 mg Oral Daily  . [START ON 02/04/2017] pneumococcal 23 valent vaccine  0.5 mL Intramuscular Tomorrow-1000  . pregabalin  50 mg Oral TID   Continuous Infusions: . piperacillin-tazobactam (ZOSYN)  IV 3.375 g (02/03/17 0631)   PRN Meds: acetaminophen **OR** acetaminophen, alum & mag hydroxide-simeth, guaifenesin, liver oil-zinc oxide, ondansetron **OR** ondansetron (ZOFRAN) IV  Allergies:  No Known Allergies  Social History:   Social History   Social History  . Marital status: Unknown     Spouse name: N/A  . Number of children: N/A  . Years of education: N/A   Occupational History  . Not on file.   Social History Main Topics  . Smoking status: Former Games developer  . Smokeless tobacco: Former Neurosurgeon  . Alcohol use No  . Drug use: No  . Sexual activity: Not Currently   Other Topics Concern  . Not on file   Social History Narrative  . No narrative on file     Family History:  Family History  Problem Relation Age of Onset  . Dementia Mother   . Dementia Father   . CAD Brother     ROS:  Review of Systems  Unable to perform ROS: Medical condition  All other systems reviewed and are negative.     Physical Exam/Data:  Vitals:   02/03/17 0300 02/03/17 0408 02/03/17 0500 02/03/17 1015  BP: (!) 147/75 (!) 154/75  136/82  Pulse: 88 95    Resp: 18 20    Temp:  98.8 F (37.1 C)    TempSrc:  Oral    SpO2: 95% 93%    Weight:   174 lb 8 oz (79.2 kg)   Height:        Intake/Output Summary (Last 24 hours) at 02/03/17 1117 Last data filed at 02/03/17 0529  Gross per 24 hour  Intake              240 ml  Output              400 ml  Net             -160 ml   Filed Weights   02/02/17 2340 02/03/17 0500  Weight: 170 lb (77.1 kg) 174 lb 8 oz (79.2 kg)   Body mass index is 25.04 kg/m.   Physical Exam: General: Well developed, well nourished, in no acute distress. Head: Normocephalic, atraumatic, sclera non-icteric, no xanthomas, nares without discharge.  Neck: Negative for carotid bruits. JVD not elevated. Lungs: Diminished breath sounds bilaterally with faint bibasilar crackles. Breathing is unlabored. Heart: RRR with S1 S2. No murmurs, rubs, or gallops appreciated. Abdomen: Soft, non-tender, non-distended with normoactive bowel sounds. No hepatomegaly. No rebound/guarding. No obvious abdominal masses. Msk:  Strength and tone appear normal for age. Extremities: No clubbing or cyanosis. No edema. Distal pedal pulses are 2+ and equal bilaterally. Neuro: Alert. No  facial asymmetry.  Psych:  Mostly aphasic.   EKG:  The EKG was personally reviewed and demonstrates: NSR, 93 bpm, poor quality study with baseline artifact, prior inferolateral infarct, nonspecific st/t changes Telemetry:  Telemetry was personally reviewed and demonstrates: NSR, 80s bpm  Weights: Filed Weights   02/02/17 2340 02/03/17 0500  Weight: 170 lb (77.1 kg) 174 lb 8 oz (79.2 kg)    Relevant CV Studies: TTE 08/08/2016:  Left ventricle:  The cavity size was normal. Systolic function was normal. The estimated ejection fraction was in the range of 55% to 60%. Wall motion was normal; there were no regional wall motion abnormalities.  ------------------------------------------------------------------- Aortic valve:   Trileaflet; normal thickness, mildly calcified leaflets. Mobility was not restricted.  Doppler:  Transvalvular velocity was within the normal range. There was no stenosis. There was no regurgitation.  ------------------------------------------------------------------- Aorta:  The aorta was normal, not dilated, and non-diseased. Aortic root: The aortic root was normal in size.  ------------------------------------------------------------------- Mitral valve:   Mildly thickened leaflets . Mobility was not restricted.  Doppler:  Transvalvular velocity was within the normal range. There was no evidence for stenosis. There was trivial regurgitation.  ------------------------------------------------------------------- Left atrium:  The atrium was at the upper limits of normal in size.   ------------------------------------------------------------------- Atrial septum:  No defect or patent foramen ovale was identified.   ------------------------------------------------------------------- Right ventricle:  The cavity size was normal. Wall thickness was normal. Systolic function was  normal.  ------------------------------------------------------------------- Pulmonic valve:    Doppler:  Transvalvular velocity was within the normal range. There was no evidence for stenosis. There was mild regurgitation.  ------------------------------------------------------------------- Tricuspid valve:   Structurally normal valve.    Doppler: Transvalvular velocity was within the normal range. There was mild regurgitation.  ------------------------------------------------------------------- Pulmonary artery:   The main pulmonary artery was normal-sized. Systolic pressure was within the normal range.  ------------------------------------------------------------------- Right atrium:  The atrium was normal in size.  -------------------------------------------------------------------  Pericardium:  The pericardium was normal in appearance. There was no pericardial effusion.  ------------------------------------------------------------------- Systemic veins: Inferior vena cava: The vessel was normal in size. The respirophasic diameter changes were in the normal range (>= 50%), consistent with normal central venous pressure.  Laboratory Data:  Chemistry Recent Labs Lab 02/02/17 2340  NA 137  K 4.7  CL 105  CO2 23  GLUCOSE 121*  BUN 31*  CREATININE 1.62*  CALCIUM 8.6*  GFRNONAA 42*  GFRAA 49*  ANIONGAP 9    No results for input(s): PROT, ALBUMIN, AST, ALT, ALKPHOS, BILITOT in the last 168 hours. Hematology Recent Labs Lab 02/02/17 2340  WBC 9.6  RBC 3.83*  HGB 9.7*  HCT 30.6*  MCV 79.9*  MCH 25.3*  MCHC 31.7*  RDW 16.9*  PLT 354   Cardiac Enzymes Recent Labs Lab 02/02/17 2340  TROPONINI <0.03   No results for input(s): TROPIPOC in the last 168 hours.  BNP Recent Labs Lab 02/02/17 2340  BNP 571.0*    DDimer No results for input(s): DDIMER in the last 168 hours.  Radiology/Studies:  Dg Chest Port 1 View  Result Date:  02/03/2017 IMPRESSION: Mild cardiomegaly with findings of CHF. Superimposed pneumonia is not excluded. Clinical correlation is recommended. Probable small bilateral pleural effusions. Electronically Signed   By: Elgie Collard M.D.   On: 02/03/2017 00:14    Assessment and Plan:   1. Acute respiratory distress with hypoxia: -Possibly multifactorial including possible mild CHF, anemia, and possible aspiration PNA -Wean oxygen per IM  2. Possible mild CHF: -Type unknown, previous normal echo in 07/2016 as above -Check echo to evaluate for newly reduced EF, WMA, and right-sided pressure -If patient is found to have newly reduced EF or WMA suggestive of ischemia he may require ischemic workup prior to discharge  -He has minimal documented UOP to IV Lasix 40 mg x 2  -Renal function appeared to indicate the patient was on the dry side upon admission and has not been checked since giving IV Lasix x 2 -Check renal function, if SCr and BUN are trending upwards would hold further diuresis at this time  3. AKI: -Hold Lasix for now -Per IM  4. Anemia: -Per IM  5. History of stroke: -On Plavix per neurology -Per IM  6. Possible aspiration PNA: -Per IM   For questions or updates, please contact [facility] Please consult [email or web address] for contact info under Cardiology/STEMI.   Signed, Eula Listen, PA-C [facility] Pager: [phone] 02/03/2017, 11:17 AM

## 2017-02-03 NOTE — Evaluation (Signed)
Clinical/Bedside Swallow Evaluation Patient Details  Name: Malik JanskyJames A Bruce MRN: [id number] Date of Birth: 1951/04/09  Today's Date: 02/03/2017 Time: SLP Start Time (ACUTE ONLY): 0845 SLP Stop Time (ACUTE ONLY): 0945 SLP Time Calculation (min) (ACUTE ONLY): 60 min  Past Medical History:  Past Medical History:  Diagnosis Date  . CVA (cerebral vascular accident) (HCC)   . DM type 2 (diabetes mellitus, type 2) (HCC)   . HTN (hypertension)    Past Surgical History: History reviewed. No pertinent surgical history. HPI:  Pt  is a 66 y.o. male who comes in by ambulance from LTC. He was sent for breathing difficulties. EMS states that his O2 sats are 85% on room air. EMS states that the patient was left in his room eating on his own. They were concerned that he might have choked and could've aspirated. The patient said he did become a little chilled. His shortness of breath started this evening. The patient states that he's had a mild cough with no productive him. The patient denies any chest pains or shortness of breath. He states that he does feel a little dizzy. The patient denies any nausea or vomiting as well. The patient does have some difficulty speaking but he does have a history of a stroke in 07/2016; he is able to follow along w/ instruction w/ min verbal cues/direction. The patient does take Lasix so there is a question of if he has a history of congestive heart failure. Of note, pt had a MBSS in 07/2016 which revealed Pt demonstrates a moderate to severe oral and oropharyngeal dysphagia. Lingual weakness results in impared manipulation and formation of bolus with premature spillage, pumping mechanism and oral residuals. Oropharyngeal phase characterzied by delayed swallow initiation leading to aspiration before the swallow with thin liquid and large nectar thick boluses; sensation only with large quantity of aspiration. There is also significant base of tongue weakenss with mdoerate vallcuelar  residuals. Also impacting pts function is difficulty following instructions due to aphasia and impulsivity and decreased awareness with self feeding. SLP had to provide max assist to decrease bolus size, resorting finally to spoon feeding. Pt also need max visual and tactile assist to attempt positional strategies (chin tuck, head turn/tilt - not effective in reducing residue). Pt also could not achieve a consistent second swallow or effortful swallo with commands and volitional cough was very weak. Pt will be at risk of aspiration with all PO consistencies, but best conservative consistency to test for toelrance is dys 1 (puree) with nectar thick liquids via teaspoon.    Assessment / Plan / Recommendation Clinical Impression  Pt appears to present w/ oropharyngeal phase dysphagia w/ moderate oral weakness requiring min more time for lingual manipulation and A-P transfer for swallowing and clearing; audible swallows were heard intermittently. Pt was only assessed primarily w/ trials of Honey consistency liquids via tsp and purees secondary to this dx of dysphagia w/ MBSS in 07/2016 - he was placed on a dysphagia level 1 diet w/ Nectar liquids then per SLP recommendation. Pt appeared to present w/ similar oral management and swallow response as described somewhat in the MBSS report then. Pt required feeding support d/t RUE weakness/paresis; verbal cues for follow through w/ tasks intermittently. Pt does have a baseline of Aphasia impacting his expressive language abilities but seems to follow through receptively w/ cues. Recommend a dysphagia 1 diet w/ Honey consistency liquids at this time w/ potential f/u w/ objective swallow study next 1-2 days for further assessment. Recommend  aspiration precautions; pills in Puree - crushed as able. NSG and MD updated. SLP Visit Diagnosis: Dysphagia, oropharyngeal phase (R13.12)    Aspiration Risk  Moderate aspiration risk    Diet Recommendation  Dysphagia level 1 w/  Honey consistency liquids via TSP; aspiration precautions; feeding support  Medication Administration: Crushed with puree    Other  Recommendations Recommended Consults:  (Dietician f/u) Oral Care Recommendations: Oral care BID;Staff/trained caregiver to provide oral care Other Recommendations: Order thickener from pharmacy;Prohibited food (jello, ice cream, thin soups);Remove water pitcher;Have oral suction available   Follow up Recommendations Skilled Nursing facility      Frequency and Duration min 3x week  2 weeks       Prognosis Prognosis for Safe Diet Advancement: Fair Barriers to Reach Goals: Language deficits;Severity of deficits;Time post onset      Swallow Study   General Date of Onset: 02/02/17 HPI: Pt  is a 66 y.o. male who comes in by ambulance from LTC. He was sent for breathing difficulties. EMS states that his O2 sats are 85% on room air. EMS states that the patient was left in his room eating on his own. They were concerned that he might have choked and could've aspirated. The patient said he did become a little chilled. His shortness of breath started this evening. The patient states that he's had a mild cough with no productive him. The patient denies any chest pains or shortness of breath. He states that he does feel a little dizzy. The patient denies any nausea or vomiting as well. The patient does have some difficulty speaking but he does have a history of a stroke in 07/2016; he is able to follow along w/ instruction w/ min verbal cues/direction. The patient does take Lasix so there is a question of if he has a history of congestive heart failure. Of note, pt had a MBSS in 07/2016 which revealed Pt demonstrates a moderate to severe oral and oropharyngeal dysphagia. Lingual weakness results in impared manipulation and formation of bolus with premature spillage, pumping mechanism and oral residuals. Oropharyngeal phase characterzied by delayed swallow initiation leading to  aspiration before the swallow with thin liquid and large nectar thick boluses; sensation only with large quantity of aspiration. There is also significant base of tongue weakenss with mdoerate vallcuelar residuals. Also impacting pts function is difficulty following instructions due to aphasia and impulsivity and decreased awareness with self feeding. SLP had to provide max assist to decrease bolus size, resorting finally to spoon feeding. Pt also need max visual and tactile assist to attempt positional strategies (chin tuck, head turn/tilt - not effective in reducing residue). Pt also could not achieve a consistent second swallow or effortful swallo with commands and volitional cough was very weak. Pt will be at risk of aspiration with all PO consistencies, but best conservative consistency to test for toelrance is dys 1 (puree) with nectar thick liquids via teaspoon.  Type of Study: Bedside Swallow Evaluation Previous Swallow Assessment: 07/2016 - MBSS Diet Prior to this Study: Dysphagia 1 (puree) (unsure if liquid consistency) Temperature Spikes Noted: No (wbc 9.6) Respiratory Status: Nasal cannula (2 liters) History of Recent Intubation: No Behavior/Cognition: Alert;Cooperative;Pleasant mood;Distractible;Requires cueing Oral Cavity Assessment: Dry (only min) Oral Care Completed by SLP: Yes Oral Cavity - Dentition: Missing dentition Vision: Functional for self-feeding Self-Feeding Abilities: Needs assist;Needs set up;Total assist (RUE paresis) Patient Positioning: Upright in bed Baseline Vocal Quality: Low vocal intensity (dysarthric; dysfluent; aphasia) Volitional Cough: Strong Volitional Swallow: Able to elicit  Oral/Motor/Sensory Function Overall Oral Motor/Sensory Function: Moderate impairment Facial ROM: Reduced right (min) Facial Symmetry: Abnormal symmetry right (min) Lingual ROM: Reduced right Lingual Symmetry: Abnormal symmetry right Lingual Strength: Reduced Velum:   (CNT) Mandible:  (CNT)   Ice Chips Ice chips: Impaired Presentation: Spoon (fed; 3 trials) Oral Phase Impairments: Reduced lingual movement/coordination Oral Phase Functional Implications: Prolonged oral transit (min) Pharyngeal Phase Impairments:  (none but potential risk for silent aspiration)   Thin Liquid Thin Liquid: Not tested    Nectar Thick Nectar Thick Liquid: Not tested   Honey Thick Honey Thick Liquid: Impaired Presentation: Spoon (fed; ~2-3 ozs) Oral Phase Impairments: Reduced lingual movement/coordination;Reduced labial seal Oral Phase Functional Implications: Prolonged oral transit (min) Pharyngeal Phase Impairments:  (audible swallow)   Puree Puree: Impaired Presentation: Spoon (fed; ~2-3 ozs) Oral Phase Impairments: Reduced lingual movement/coordination;Reduced labial seal Oral Phase Functional Implications: Prolonged oral transit (min) Pharyngeal Phase Impairments:  (none)   Solid   GO   Solid: Not tested    Functional Assessment Tool Used: clinical judgement Functional Limitations: Swallowing Swallow Current Status ([id number]): At least 60 percent but less than 80 percent impaired, limited or restricted Swallow Goal Status [phone]): At least 40 percent but less than 60 percent impaired, limited or restricted Swallow Discharge Status [phone]): At least 40 percent but less than 60 percent impaired, limited or restricted    Jerilynn Som, MS, CCC-SLP [facility] 02/03/2017,10:18 AM

## 2017-02-03 NOTE — Progress Notes (Signed)
The patient is admitted for acute on chronic CHF ejection fraction 50-50% and possible pneumonia. He has chronic aphasia. vital signs are stable. Physical examinations: Left lung basilar crackles. Aphasia and Right hemiparesis. Continue current treatment plan.  Time spent about 26 minutes.

## 2017-02-03 NOTE — Progress Notes (Signed)
Initial Nutrition Assessment  DOCUMENTATION CODES:   Not applicable  INTERVENTION:  Hormel Shake daily, each supplement is 500 calories and 23 grams of protein MVI w/ Minerals  NUTRITION DIAGNOSIS:   Swallowing difficulty related to dysphagia as evidenced by other (see comment) (on NDD1, honey thickened liquids ).  GOAL:   Patient will meet greater than or equal to 90% of their needs  MONITOR:   Supplement acceptance, I & O's, Labs, Skin, PO intake, Weight trends  REASON FOR ASSESSMENT:   Consult Assessment of nutrition requirement/status  ASSESSMENT:   Malik Bruce has a PMH of CVA, CHF, DM2, HTN, presents from nursing home after potential aspiration, acute on chronic CHF   Attempted to speak with patient at bedside but he is unable to communicate due to chronic aphasia. Exhibits interstitial edema. Hx of hemiparesis/paraplegia. Patient does not appear malnourished based on assessment of temples and buccal fat. May exhibit fat depletions at arms but unsure due to edema. Skin very dry, flaky. [facility] at 164 pounds, was not found to be malnourished at that time. Unable to get diet history from patient. Currently on NDD1 diet with honey thickened liquids.  Medications reviewed and include:  Vitamin B complex with C, B12, Colace, Iron Novolog 0-5 Units HS, 0-9 Units TID with meals Labs reviewed:  CBG 130  Diet Order:  DIET - DYS 1 Room service appropriate? Yes with Assist; Fluid consistency: Honey Thick  Skin:  Reviewed, no issues  Last BM:  02/03/2017  Height:   Ht Readings from Last 1 Encounters:  02/02/17 5\' 10"  (1.778 m)    Weight:   Wt Readings from Last 1 Encounters:  02/03/17 174 lb 8 oz (79.2 kg)    Ideal Body Weight:  75.45 kg  BMI:  Body mass index is 25.04 kg/m.  Estimated Nutritional Needs:   Kcal:  1600-2000 calories  Protein:  103-119 grams (1.3-1.5g/kg)  Fluid:  1.6-2L  EDUCATION NEEDS:   Education needs no  appropriate at this time  Dionne AnoWilliam M. Nyjae Hodge, MS, RD LDN Inpatient Clinical Dietitian Pager [phone]

## 2017-02-03 NOTE — ED Notes (Signed)
meds given

## 2017-02-03 NOTE — H&P (Signed)
Malik Bruce is an 66 y.o. male.   Chief Complaint: Respiratory distress HPI: The patient with past medical history of stroke, hypertension and diabetes presents to the emergency department from his nursing home after an episode described by nursing staff as potential aspiration. The patient is not a good historian as he has significant aphasia and mental slowing. Upon arrival the patient was tachypneic and hypoxic to 86% on room air. He was placed on nasal cannula 2 L of oxygen which improved his work of breathing and pulse oximetry. Laboratory evaluation revealed elevated BNP and chest x-ray was consistent with interstitial edema. Once patient was stabilized emergency department staff called the hospitalist service  Past Medical History:  Diagnosis Date  . CVA (cerebral vascular accident) ([place])   . DM type 2 (diabetes mellitus, type 2) ([place])   . HTN (hypertension)     History reviewed. No pertinent surgical history. None (the patient cannot contribute to his history)  Family History  Problem Relation Age of Onset  . Dementia Mother   . Dementia Father   . CAD Brother    Social History:  reports that he has quit smoking. He has quit using smokeless tobacco. He reports that he does not drink alcohol or use drugs.  Allergies: No Known Allergies  Medications Prior to Admission  Medication Sig Dispense Refill  . acetaminophen (TYLENOL) 500 MG tablet Take 500 mg by mouth every 4 (four) hours as needed for mild pain, fever or headache.     Marland Kitchen acetaminophen (TYLENOL) 500 MG tablet Take 1,000 mg by mouth 2 (two) times daily.    Marland Kitchen alum & mag hydroxide-simeth (MINTOX) 440-102-72 MG/5ML suspension Take 30 mLs by mouth as needed for indigestion or heartburn (Do not exceed 4 doses in 24 hours).    Marland Kitchen atorvastatin (LIPITOR) 40 MG tablet Take 40 mg by mouth daily.    Marland Kitchen b complex vitamins tablet Take 1 tablet by mouth daily.    . baclofen (LIORESAL) 10 MG tablet Take 5 mg by mouth 3 (three) times  daily.     . cefUROXime (CEFTIN) 500 MG tablet Take 500 mg by mouth 2 (two) times daily with a meal.    . clopidogrel (PLAVIX) 75 MG tablet Take 1 tablet (75 mg total) by mouth daily. 30 tablet 0  . cyanocobalamin (,VITAMIN B-12,) 1000 MCG/ML injection Inject 1,000 mcg into the muscle every 30 (thirty) days. On the 11th of every month    . ferrous sulfate 325 (65 FE) MG tablet Take 325 mg by mouth 2 (two) times daily.    . fluticasone (FLONASE) 50 MCG/ACT nasal spray Place 1 spray into both nostrils daily.    . furosemide (LASIX) 20 MG tablet Take 30 mg by mouth daily.    Marland Kitchen guaifenesin (ROBAFEN) 100 MG/5ML syrup Take 200 mg by mouth every 6 (six) hours as needed for cough (Do not exceed 4 doses in 24 hours).    . liver oil-zinc oxide (DESITIN) 40 % ointment Apply 1 application topically as needed for irritation.    Marland Kitchen liver oil-zinc oxide (DESITIN) 40 % ointment Apply 1 application topically 2 (two) times daily.    . Melatonin 3 MG TABS Take 3 mg by mouth at bedtime.    . metFORMIN (GLUCOPHAGE) 500 MG tablet Take 1,000 mg by mouth 2 (two) times daily with a meal.    . metoprolol tartrate (LOPRESSOR) 25 MG tablet Take 25 mg by mouth daily.    . pregabalin (LYRICA) 50 MG  capsule Take 50 mg by mouth 3 (three) times daily.      Results for orders placed or performed during the hospital encounter of 02/02/17 (from the past 48 hour(s))  Basic metabolic panel     Status: Abnormal   Collection Time: 02/02/17 11:40 PM  Result Value Ref Range   Sodium 137 135 - 145 mmol/L   Potassium 4.7 3.5 - 5.1 mmol/L   Chloride 105 101 - 111 mmol/L   CO2 23 22 - 32 mmol/L   Glucose, Bld 121 (H) 65 - 99 mg/dL   BUN 31 (H) 6 - 20 mg/dL   Creatinine, Ser 1.62 (H) 0.61 - 1.24 mg/dL   Calcium 8.6 (L) 8.9 - 10.3 mg/dL   GFR calc non Af Amer 42 (L) >60 mL/min   GFR calc Af Amer 49 (L) >60 mL/min    Comment: (NOTE) The eGFR has been calculated using the CKD EPI equation. This calculation has not been validated in  all clinical situations. eGFR's persistently <60 mL/min signify possible Chronic Kidney Disease.    Anion gap 9 5 - 15  CBC     Status: Abnormal   Collection Time: 02/02/17 11:40 PM  Result Value Ref Range   WBC 9.6 3.8 - 10.6 K/uL   RBC 3.83 (L) 4.40 - 5.90 MIL/uL   Hemoglobin 9.7 (L) 13.0 - 18.0 g/dL   HCT 30.6 (L) 40.0 - 52.0 %   MCV 79.9 (L) 80.0 - 100.0 fL   MCH 25.3 (L) 26.0 - 34.0 pg   MCHC 31.7 (L) 32.0 - 36.0 g/dL   RDW 16.9 (H) 11.5 - 14.5 %   Platelets 354 150 - 440 K/uL  Troponin I     Status: None   Collection Time: 02/02/17 11:40 PM  Result Value Ref Range   Troponin I <0.03 <0.03 ng/mL  Brain natriuretic peptide     Status: Abnormal   Collection Time: 02/02/17 11:40 PM  Result Value Ref Range   B Natriuretic Peptide 571.0 (H) 0.0 - 100.0 pg/mL   Dg Chest Port 1 View  Result Date: 02/03/2017 CLINICAL DATA:  66 year old male with shortness of breath. EXAM: PORTABLE CHEST 1 VIEW COMPARISON:  Chest radiograph dated 11/24/2016 FINDINGS: There is mild cardiomegaly. There is diffuse interstitial and vascular prominence consistent with vascular congestion. Superimposed pneumonia is not excluded. Probable bilateral pleural effusions. No pneumothorax. There is osteopenia with degenerative changes of the spine and shoulders. No acute osseous pathology. IMPRESSION: Mild cardiomegaly with findings of CHF. Superimposed pneumonia is not excluded. Clinical correlation is recommended. Probable small bilateral pleural effusions. Electronically Signed   By: Anner Crete M.D.   On: 02/03/2017 00:14    Review of Systems  Constitutional: Negative for chills and fever.  HENT: Negative for sore throat and tinnitus.   Eyes: Negative for blurred vision and redness.  Respiratory: Negative for cough and shortness of breath.   Cardiovascular: Negative for chest pain, palpitations, orthopnea and PND.  Gastrointestinal: Negative for abdominal pain, diarrhea, nausea and vomiting.   Genitourinary: Negative for dysuria, frequency and urgency.  Musculoskeletal: Negative for joint pain and myalgias.  Skin: Negative for rash.       No lesions  Neurological: Negative for speech change, focal weakness and weakness.  Endo/Heme/Allergies: Does not bruise/bleed easily.       No temperature intolerance  Psychiatric/Behavioral: Negative for depression and suicidal ideas.    Blood pressure (!) 154/75, pulse 95, temperature 98.8 F (37.1 C), temperature source Oral, resp. rate 20,  height 5' 10"  (1.778 m), weight 77.1 kg (170 lb), SpO2 93 %. Physical Exam  Vitals reviewed. Constitutional: He is oriented to person, place, and time. He appears well-developed and well-nourished. No distress.  HENT:  Head: Normocephalic and atraumatic.  Mouth/Throat: Oropharynx is clear and moist.  Eyes: Pupils are equal, round, and reactive to light. Conjunctivae and EOM are normal. No scleral icterus.  Neck: Normal range of motion. Neck supple. No JVD present. No tracheal deviation present. No thyromegaly present.  Cardiovascular: Normal rate and regular rhythm.  Exam reveals distant heart sounds. Exam reveals no gallop and no friction rub.   No murmur heard. Respiratory: Effort normal and breath sounds normal.  GI: Soft. Bowel sounds are normal. He exhibits no distension. There is no tenderness.  Genitourinary:  Genitourinary Comments: Deferred  Musculoskeletal: He exhibits edema.  Lymphadenopathy:    He has no cervical adenopathy.  Neurological: He is alert and oriented to person, place, and time. A cranial nerve deficit is present.  Right hemiplegia  Skin: Skin is warm and dry.  Tight and shiny  Psychiatric: He has a normal mood and affect. His behavior is normal.  Aphasia makes difficult to assess thought and judgement     Assessment/Plan This is a 66 year old male admitted for heart failure. 1. CHF: Acute on chronic; systolic. Previous EF 50-55%. Now the patient has interstitial  edema and elevated BNP. The patient's lower extremity is are also edematous although this may be secondary to disuse due to hemiparesis/paraplegia. He has been given Lasix 40 mg IV in the emergency department. I will give another dose 6 hours later and reassess fluid balance. 2. Tachypnea: Etiology could be multifactorial. Respiratory rate has improved but is still high following diuresis. Report from the patient's nursing home was also that he may have aspirated which is actually the reason he presented to the emergency department. Chest x-ray interpretation cannot exclude superimposed pneumonia. Thus I have given the patient dose of Zosyn. Continue to monitor pulse oximetry and respiratory rate. He does not have a leukocytosis at this time. 3. Acute kidney injury: Likely prerenal due to decreased cardiac output. Balance diuresis with kidney function. Avoid nephrotoxic agents. 4. Hypertension: Permissive for age. Continue metoprolol 5. Diabetes mellitus type 2: Hold metformin. Sliding scale insulin while hospitalized. 6. Skin atrophy: The patient skin is tight and sclerosed on hands and there is a shiny appearance suggesting scleroderma. Review of the patient's laboratory history and outpatient visits shows that he had an elevated sedimentation rate at one point. He is also seen rheumatology but there are no definitive conclusions regarding rheumatological disease. The patient may have dysphasia secondary to his stroke but also from esophageal hardening. Consider rheumatology consultation. 7. History of stroke: Continue secondary prevention with aspirin, Plavix and statin therapy 8. DVT prophylaxis: Heparin 9. GI prophylaxis: None The patient is a full code. Time spent on admission orders and patient care approximately 45 minutes   Harrie Foreman, MD 02/03/2017, 5:29 AM

## 2017-02-03 NOTE — Plan of Care (Signed)
Problem: SLP Dysphagia Goals Goal: Misc Dysphagia Goal Pt will safely tolerate po diet of least restrictive consistency w/ no overt s/s of aspiration noted by Staff/pt/family x3 sessions.    

## 2017-02-03 NOTE — Progress Notes (Signed)
Pharmacy Antibiotic Note  Malik JanskyJames A Bruce is a 66 y.o. male admitted on 02/02/2017 with pneumonia.  Pharmacy has been consulted for zosyn dosing.  Plan: Zosyn 3.375g IV q8h (4 hour infusion).  Patient is on cefuroxime 500 mg bid from NH for unknown indication (probably UTI) will hold while patient is on zosyn for aspiration pneumonia.  Height: 5\' 10"  (177.8 cm) Weight: 174 lb 8 oz (79.2 kg) IBW/kg (Calculated) : 73  Temp (24hrs), Avg:98.8 F (37.1 C), Min:98.8 F (37.1 C), Max:98.8 F (37.1 C)   Recent Labs Lab 02/02/17 2340  WBC 9.6  CREATININE 1.62*    Estimated Creatinine Clearance: 45.1 mL/min (A) (by C-G formula based on SCr of 1.62 mg/dL (H)).    No Known Allergies  Thank you for allowing pharmacy to be a part of this patient's care.  Thomasene Rippleavid Martika Egler, PharmD, BCPS Clinical Pharmacist 02/03/2017

## 2017-02-03 NOTE — NC FL2 (Signed)
[place] MEDICAID FL2 LEVEL OF CARE SCREENING TOOL     IDENTIFICATION  Patient Name: Malik Bruce Birthdate: 1951-03-27 Sex: male Admission Date (Current Location): 02/02/2017  [facility] [place]   Facility and Address:  [facility], [address], [place], [place] [address]      Provider Number: [id number]  Attending Physician Name and Address:  Shaune Pollackhen, Azaria Bartell, MD  Relative Name and Phone Number:       Current Level of Care: Hospital Recommended Level of Care: Assisted Living Facility Prior Approval Number:    Date Approved/Denied:   PASRR Number:    Discharge Plan: Domiciliary (Rest home)    Current Diagnoses: Patient Active Problem List   Diagnosis Date Noted  . Acute on chronic systolic heart failure (HCC) 02/03/2017  . CVA (cerebral vascular accident) (HCC) 08/06/2016  . Acute metabolic encephalopathy 08/06/2016  . Leukocytosis 08/06/2016  . Anemia due to chronic kidney disease 08/06/2016  . Renal insufficiency 08/06/2016  . Hyperlipidemia 08/06/2016  . CVA, old, hemiparesis (HCC) 08/06/2016    Orientation RESPIRATION BLADDER Height & Weight     Self, Place, Situation  Normal Incontinent Weight: 174 lb 8 oz (79.2 kg) Height:  5\' 10"  (177.8 cm)  BEHAVIORAL SYMPTOMS/MOOD NEUROLOGICAL BOWEL NUTRITION STATUS      Continent Diet (Diet: DYS 1 )  AMBULATORY STATUS COMMUNICATION OF NEEDS Skin   Extensive Assist Verbally Normal                       Personal Care Assistance Level of Assistance  Bathing, Feeding, Dressing Bathing Assistance: Limited assistance Feeding assistance: Limited assistance Dressing Assistance: Limited assistance     Functional Limitations Info  Sight, Hearing, Speech Sight Info: Adequate Hearing Info: Adequate Speech Info: Adequate    SPECIAL CARE FACTORS FREQUENCY  PT (By licensed PT)     PT Frequency:  (2-3 home health )              Contractures      Additional  Factors Info  Code Status, Allergies, Isolation Precautions Code Status Info:  (Full Code. ) Allergies Info:  (No Known Allergies. )     Isolation Precautions Info:  (MRSA Nasal Swab. )     Current Medications (02/03/2017):  This is the current hospital active medication list Current Facility-Administered Medications  Medication Dose Route Frequency Provider Last Rate Last Dose  . acetaminophen (TYLENOL) tablet 650 mg  650 mg Oral Q6H PRN Arnaldo Nataliamond, Michael S, MD       Or  . acetaminophen (TYLENOL) suppository 650 mg  650 mg Rectal Q6H PRN Arnaldo Nataliamond, Michael S, MD      . alum & mag hydroxide-simeth (MAALOX/MYLANTA) 200-200-20 MG/5ML suspension 30 mL  30 mL Oral PRN Arnaldo Nataliamond, Michael S, MD      . atorvastatin (LIPITOR) tablet 40 mg  40 mg Oral Daily Arnaldo Nataliamond, Michael S, MD      . B-complex with vitamin C tablet 1 tablet  1 tablet Oral Daily Arnaldo Nataliamond, Michael S, MD   1 tablet at 02/03/17 1017  . baclofen (LIORESAL) tablet 5 mg  5 mg Oral TID Arnaldo Nataliamond, Michael S, MD   5 mg at 02/03/17 1015  . clopidogrel (PLAVIX) tablet 75 mg  75 mg Oral Daily Arnaldo Nataliamond, Michael S, MD   75 mg at 02/03/17 1016  . [START ON 02/21/2017] cyanocobalamin ((VITAMIN B-12)) injection 1,000 mcg  1,000 mcg Intramuscular Q30 days Arnaldo Nataliamond, Michael S, MD      .  docusate sodium (COLACE) capsule 100 mg  100 mg Oral BID Arnaldo Natal, MD   100 mg at 02/03/17 1016  . ferrous sulfate tablet 325 mg  325 mg Oral BID Arnaldo Natal, MD   325 mg at 02/03/17 1016  . fluticasone (FLONASE) 50 MCG/ACT nasal spray 1 spray  1 spray Each Nare Daily Arnaldo Natal, MD   1 spray at 02/03/17 1018  . guaifenesin (ROBITUSSIN) 100 MG/5ML syrup 200 mg  200 mg Oral Q6H PRN Arnaldo Natal, MD      . heparin injection 5,000 Units  5,000 Units Subcutaneous Q8H Arnaldo Natal, MD   5,000 Units at 02/03/17 [phone]  . [START ON 02/04/2017] Influenza vac split quadrivalent PF (FLUZONE HIGH-DOSE) injection 0.5 mL  0.5 mL Intramuscular Tomorrow-1000  Arnaldo Natal, MD      . insulin aspart (novoLOG) injection 0-5 Units  0-5 Units Subcutaneous QHS Arnaldo Natal, MD      . insulin aspart (novoLOG) injection 0-9 Units  0-9 Units Subcutaneous TID WC Arnaldo Natal, MD   1 Units at 02/03/17 0910  . liver oil-zinc oxide (DESITIN) 40 % ointment 1 application  1 application Topical PRN Arnaldo Natal, MD      . Melatonin TABS 5 mg  5 mg Oral QHS Arnaldo Natal, MD      . metoprolol tartrate (LOPRESSOR) tablet 25 mg  25 mg Oral Daily Arnaldo Natal, MD   25 mg at 02/03/17 1016  . ondansetron (ZOFRAN) tablet 4 mg  4 mg Oral Q6H PRN Arnaldo Natal, MD       Or  . ondansetron [facility]) injection 4 mg  4 mg Intravenous Q6H PRN Arnaldo Natal, MD      . piperacillin-tazobactam (ZOSYN) IVPB 3.375 g  3.375 g Intravenous Q8H Arnaldo Natal, MD   Stopped at 02/03/17 1030  . [START ON 02/04/2017] pneumococcal 23 valent vaccine (PNU-IMMUNE) injection 0.5 mL  0.5 mL Intramuscular Tomorrow-1000 Arnaldo Natal, MD      . pregabalin (LYRICA) capsule 50 mg  50 mg Oral TID Arnaldo Natal, MD   50 mg at 02/03/17 1016     Discharge Medications: Please see discharge summary for a list of discharge medications.  Relevant Imaging Results:  Relevant Lab Results:   Additional Information  (SSN: [id number])  Sample, Darleen Crocker, LCSW

## 2017-02-03 NOTE — ED Notes (Signed)
Pt alert.  No acute resp distress

## 2017-02-03 NOTE — ED Notes (Signed)
Report off to kailey rn  

## 2017-02-03 NOTE — ED Notes (Signed)
Report given by Linton RumpAmy, RN at [phone]

## 2017-02-04 ENCOUNTER — Inpatient Hospital Stay (HOSPITAL_COMMUNITY)
Admit: 2017-02-04 | Discharge: 2017-02-04 | Disposition: A | Discharge status: Home / Self Care | Payer: Medicare Other | Attending: Internal Medicine | Admitting: Internal Medicine

## 2017-02-04 DIAGNOSIS — I509 Heart failure, unspecified: Secondary | ICD-10-CM

## 2017-02-04 LAB — GLUCOSE, CAPILLARY
Glucose-Capillary: 77 mg/dL (ref 65–99)
Glucose-Capillary: 63 mg/dL — ABNORMAL LOW (ref 65–99)
Glucose-Capillary: 93 mg/dL (ref 65–99)
Glucose-Capillary: 157 mg/dL — ABNORMAL HIGH (ref 65–99)
Glucose-Capillary: 173 mg/dL — ABNORMAL HIGH (ref 65–99)

## 2017-02-04 LAB — ECHOCARDIOGRAM COMPLETE
Height: 70 in
Weight: 2788.8 oz

## 2017-02-04 MED ORDER — ENOXAPARIN SODIUM 40 MG/0.4ML ~~LOC~~ SOLN
40.0000 mg | SUBCUTANEOUS | Status: DC
  Administered 2017-02-04: 40 mg via SUBCUTANEOUS
  Filled 2017-02-04: qty 0.4

## 2017-02-04 MED ORDER — CHLORHEXIDINE GLUCONATE CLOTH 2 % EX PADS
6.0000 | MEDICATED_PAD | Freq: Every day | CUTANEOUS | Status: DC
  Administered 2017-02-04 – 2017-02-05 (×2): 6 via TOPICAL

## 2017-02-04 MED ORDER — SODIUM CHLORIDE 0.9 % IV SOLN
3.0000 g | Freq: Four times a day (QID) | INTRAVENOUS | Status: DC
  Administered 2017-02-04 – 2017-02-05 (×5): 3 g via INTRAVENOUS
  Filled 2017-02-04 (×8): qty 3

## 2017-02-04 MED ORDER — MUPIROCIN 2 % EX OINT
1.0000 "application " | TOPICAL_OINTMENT | Freq: Two times a day (BID) | CUTANEOUS | Status: DC
  Administered 2017-02-04 – 2017-02-05 (×3): 1 via NASAL
  Filled 2017-02-04: qty 22

## 2017-02-04 NOTE — Progress Notes (Signed)
*  PRELIMINARY RESULTS* Echocardiogram 2D Echocardiogram has been performed.  Malik Bruce, Malik Bruce 02/04/2017, 10:48 AM

## 2017-02-04 NOTE — Progress Notes (Signed)
[facility] [place] [facility]   PATIENT NAME: Malik Bruce    MR#:  [id number]  DATE OF BIRTH:  February 03, 1951  SUBJECTIVE:   Patient here due to acute respiratory failure secondary to CHF/aspiration pneumonitis. Patient himself is difficult to understand. No other acute events overnight. Respiratory status remained stable.  REVIEW OF SYSTEMS:    Review of Systems  Unable to perform ROS: Mental acuity    Nutrition: Dysphagia 1 with nectar thick liquids Tolerating Diet: Yes Tolerating PT: Bedbound at baseline.    DRUG ALLERGIES:  No Known Allergies  VITALS:  Blood pressure 139/72, pulse 79, temperature 97.7 F (36.5 C), temperature source Oral, resp. rate 18, height 5\' 10"  (1.778 m), weight 79.1 kg (174 lb 4.8 oz), SpO2 97 %.  PHYSICAL EXAMINATION:   Physical Exam  GENERAL:  66 y.o.-year-old patient lying in bed in no acute distress.  EYES: Pupils equal, round, reactive to light and accommodation. No scleral icterus. Extraocular muscles intact.  HEENT: Head atraumatic, normocephalic. Oropharynx and nasopharynx clear.  NECK:  Supple, no jugular venous distention. No thyroid enlargement, no tenderness.  LUNGS: Poor respiratory effort, no wheezing, rales, rhonchi. No use of accessory muscles of respiration.  CARDIOVASCULAR: S1, S2 normal. No murmurs, rubs, or gallops.  ABDOMEN: Soft, nontender, nondistended. Bowel sounds present. No organomegaly or mass.  EXTREMITIES: No cyanosis, clubbing or edema b/l.    NEUROLOGIC: Cranial nerves II through XII are intact. No focal Motor or sensory deficits b/l. Right-sided hemiparesis from previous CVA  PSYCHIATRIC: The patient is alert and oriented x 1.  SKIN: No obvious rash, lesion, or ulcer.    LABORATORY PANEL:   CBC  Recent Labs Lab 02/03/17 1224  WBC 8.1  HGB 9.6*  HCT 30.6*  PLT 325    ------------------------------------------------------------------------------------------------------------------  Chemistries   Recent Labs Lab 02/03/17 1224  NA 137  K 4.3  CL 106  CO2 24  GLUCOSE 112*  BUN 34*  CREATININE 1.58*  CALCIUM 8.6*   ------------------------------------------------------------------------------------------------------------------  Cardiac Enzymes  Recent Labs Lab 02/02/17 2340  TROPONINI <0.03   ------------------------------------------------------------------------------------------------------------------  RADIOLOGY:  Dg Chest Port 1 View  Result Date: 02/03/2017 CLINICAL DATA:  66 year old male with shortness of breath. EXAM: PORTABLE CHEST 1 VIEW COMPARISON:  Chest radiograph dated 11/24/2016 FINDINGS: There is mild cardiomegaly. There is diffuse interstitial and vascular prominence consistent with vascular congestion. Superimposed pneumonia is not excluded. Probable bilateral pleural effusions. No pneumothorax. There is osteopenia with degenerative changes of the spine and shoulders. No acute osseous pathology. IMPRESSION: Mild cardiomegaly with findings of CHF. Superimposed pneumonia is not excluded. Clinical correlation is recommended. Probable small bilateral pleural effusions. Electronically Signed   By: Elgie CollardArash  Radparvar M.D.   On: 02/03/2017 00:14     ASSESSMENT AND PLAN:   66 year old male with past medical history of previous CVA with resultant right-sided hemiparesis, diabetes, hypertension who presents to the hospital due to shortness of breath and respiratory distress.  1. Acute on chronic respiratory failure-secondary to mild CHF along with aspiration pneumonitis. -Continue O2 supplementation, will switch IV Zosyn to Unasyn for the aspiration pneumonia/pneumonitis. -Continue dose Lasix on an as-needed basis. Clinically improving.  2. CHF-on chronic diastolic dysfunction. -Patient has received 2 doses of IV Lasix.  Clinically improving. Await repeat echocardiogram results. -Appreciate cardiology input.  3. Aspiration pneumonia/pneumonitis-patient was on IV Zosyn, will switch to Unasyn. Follow clinically. Seen by speech to placed on a pured diet with thin liquids.  4. History of previous CVA-continue Plavix, statin.  5. Diabetes type 2 without complication-continue sliding scale insulin.  6. Diabetic neuropathy-continue lyrica.   Possible discharge to Assisted Living tomorrow on Oral abx.   All the records are reviewed and case discussed with Care Management/Social Worker. Management plans discussed with the patient, family and they are in agreement.  CODE STATUS: Full code  DVT Prophylaxis: Lovenox  TOTAL TIME TAKING CARE OF THIS PATIENT: 30 minutes.   POSSIBLE D/C IN 1-2 DAYS, DEPENDING ON CLINICAL CONDITION.   Houston Siren M.D on 02/04/2017 at 2:52 PM  Between 7am to 6pm - Pager - [phone]  After 6pm go to [email or web address] - Social research officer, government  [facility] [place] [facility]  Office  [phone]  CC: Primary care physician; Patient, No Pcp Per

## 2017-02-04 NOTE — Progress Notes (Signed)
Clinical Child psychotherapistocial Worker (CSW) left [facility] a voicemail making her aware that patient will D/C tomorrow per MD.   Fredric MareBailey Kaidin Boehle, LCSW [phone]

## 2017-02-05 LAB — BASIC METABOLIC PANEL
Anion gap: 3 — ABNORMAL LOW (ref 5–15)
BUN: 32 mg/dL — AB (ref 6–20)
CHLORIDE: 112 mmol/L — AB (ref 101–111)
CO2: 27 mmol/L (ref 22–32)
CREATININE: 1.55 mg/dL — AB (ref 0.61–1.24)
Calcium: 8.3 mg/dL — ABNORMAL LOW (ref 8.9–10.3)
GFR calc Af Amer: 51 mL/min — ABNORMAL LOW (ref >60)
GFR calc non Af Amer: 44 mL/min — ABNORMAL LOW (ref >60)
Glucose, Bld: 100 mg/dL — ABNORMAL HIGH (ref 65–99)
POTASSIUM: 4 mmol/L (ref 3.5–5.1)
SODIUM: 142 mmol/L (ref 135–145)

## 2017-02-05 LAB — GLUCOSE, CAPILLARY
Glucose-Capillary: 89 mg/dL (ref 65–99)
Glucose-Capillary: 113 mg/dL — ABNORMAL HIGH (ref 65–99)
Glucose-Capillary: 129 mg/dL — ABNORMAL HIGH (ref 65–99)

## 2017-02-05 LAB — CBC
HCT: 28.8 % — ABNORMAL LOW (ref 40.0–52.0)
Hemoglobin: 9 g/dL — ABNORMAL LOW (ref 13.0–18.0)
MCH: 24.8 pg — AB (ref 26.0–34.0)
MCHC: 31.3 g/dL — AB (ref 32.0–36.0)
MCV: 79.4 fL — AB (ref 80.0–100.0)
PLATELETS: 307 10*3/uL (ref 150–440)
RBC: 3.62 MIL/uL — AB (ref 4.40–5.90)
RDW: 17.3 % — ABNORMAL HIGH (ref 11.5–14.5)
WBC: 8 10*3/uL (ref 3.8–10.6)

## 2017-02-05 MED ORDER — AMOXICILLIN-POT CLAVULANATE 875-125 MG PO TABS
1.0000 | ORAL_TABLET | Freq: Two times a day (BID) | ORAL | 0 refills | Status: AC
Start: 2017-02-05 — End: 2017-02-12

## 2017-02-05 NOTE — Progress Notes (Signed)
Spoke with patient's facililty and patient's POA, Esau Grewerry Bevans. Per POA, permission receive to give patient the Pneumonia Vaccination and patient will receive the Flu vaccination from his facility on 10/31when they are administered.

## 2017-02-05 NOTE — Care Management Note (Signed)
Case Management Note  Patient Details  Name: Modena JanskyJames A Reisch MRN: [id number] Date of Birth: 03-28-1951  Subjective/Objective:  Discharging today to [facility].                   Action/Plan: Will need home O2. [facility] from [facility] to be delivered. To [facility]f since patient is going via EMS.   Expected Discharge Date:  02/05/17               Expected Discharge Plan:  Assisted Living / Rest Home  In-House Referral:  Clinical Social Work  Discharge planning Services  CM Consult  Post Acute Care Choice:  Durable Medical Equipment Choice offered to:     DME Arranged:  Oxygen DME Agency:  [facility]  HH Arranged:    HH Agency:     Status of Service:  Completed, signed off  If discussed at [employer] Length of Stay Meetings, dates discussed:    Additional Comments:  Marily MemosLisa M Fiza Nation, RN 02/05/2017, 12:35 PM

## 2017-02-05 NOTE — Care Management Note (Signed)
Case Management Note  Patient Details  Name: Malik Bruce MRN: [id number] Date of Birth: Mar 18, 1951  Subjective/Objective:  Spoke with brother to discuss discharge plan. Qualifies for home O2. Ordered from [facility]. Will benefit from RN. Spoke with bother and he is agreeable with POC. Offered home health agencies. [facility]                  Action/Plan: [facility], [facility]   Expected Discharge Date:  02/05/17               Expected Discharge Plan:  Assisted Living / Rest Home  In-House Referral:  Clinical Social Work  Discharge planning Services  CM Consult  Post Acute Care Choice:  Durable Medical Equipment, Home Health Choice offered to:     DME Arranged:  Oxygen DME Agency:  [facility]  HH Arranged:  RN HH Agency:  [facility]  Status of Service:  Completed, signed off  If discussed at Long Length of Stay Meetings, dates discussed:    Additional Comments:  Marily MemosLisa M Sreeja Spies, RN 02/05/2017, 12:42 PM

## 2017-02-05 NOTE — [place] FL2 (Signed)
[place] MEDICAID FL2 LEVEL OF CARE SCREENING TOOL     IDENTIFICATION  Patient Name: Malik JanskyJames A Waites Birthdate: 04/13/1951 Sex: male Admission Date (Current Location): 02/02/2017  [facility] [place]   Facility and Address:  [facility], [address], [place], [place] [address]      Provider Number: [id number]  Attending Physician Name and Address:  Houston SirenSainani, Vivek J, MD  Relative Name and Phone Number:       Current Level of Care: Hospital Recommended Level of Care: Assisted Living Facility Prior Approval Number:    Date Approved/Denied:   PASRR Number:    Discharge Plan: Domiciliary (Rest home)    Current Diagnoses: Patient Active Problem List   Diagnosis Date Noted  . Acute on chronic systolic heart failure (HCC) 02/03/2017  . CVA (cerebral vascular accident) (HCC) 08/06/2016  . Acute metabolic encephalopathy 08/06/2016  . Leukocytosis 08/06/2016  . Anemia due to chronic kidney disease 08/06/2016  . Renal insufficiency 08/06/2016  . Hyperlipidemia 08/06/2016  . CVA, old, hemiparesis (HCC) 08/06/2016    Orientation RESPIRATION BLADDER Height & Weight     Self, Place, Situation  2 Liters Oxygen Incontinent Weight: 174 lb (78.9 kg) Height:  5\' 10"  (177.8 cm)  BEHAVIORAL SYMPTOMS/MOOD NEUROLOGICAL BOWEL NUTRITION STATUS      Continent Dysphagia level 2(minced foods); NECTAR consistency liquids. Aspiration precautions; Pills in Puree. Feeding Assistance at all meals d/t RUE weakness.   AMBULATORY STATUS COMMUNICATION OF NEEDS Skin   Extensive Assist Verbally Normal                       Personal Care Assistance Level of Assistance  Bathing, Feeding, Dressing Bathing Assistance: Limited assistance Feeding assistance: Limited assistance Dressing Assistance: Limited assistance     Functional Limitations Info  Sight, Hearing, Speech Sight Info: Adequate Hearing Info: Adequate Speech Info: Adequate     SPECIAL CARE FACTORS FREQUENCY                     Contractures      Additional Factors Info  Code Status, Allergies, Isolation Precautions Code Status Info:  (Full Code. ) Allergies Info:  (No Known Allergies. )     Isolation Precautions Info:  (MRSA Nasal Swab. )    Discharge Medications: Please see discharge summary for a list of discharge medications. Medication List     STOP taking these medications   cefUROXime 500 MG tablet Commonly known as:  CEFTIN     TAKE these medications   acetaminophen 500 MG tablet Commonly known as:  TYLENOL Take 500 mg by mouth every 4 (four) hours as needed for mild pain, fever or headache.   acetaminophen 500 MG tablet Commonly known as:  TYLENOL Take 1,000 mg by mouth 2 (two) times daily.   amoxicillin-clavulanate 875-125 MG tablet Commonly known as:  AUGMENTIN Take 1 tablet by mouth 2 (two) times daily.   atorvastatin 40 MG tablet Commonly known as:  LIPITOR Take 40 mg by mouth daily.   b complex vitamins tablet Take 1 tablet by mouth daily.   baclofen 10 MG tablet Commonly known as:  LIORESAL Take 5 mg by mouth 3 (three) times daily.   clopidogrel 75 MG tablet Commonly known as:  PLAVIX Take 1 tablet (75 mg total) by mouth daily.   cyanocobalamin 1000 MCG/ML injection Commonly known as:  (VITAMIN B-12) Inject 1,000 mcg into the muscle every 30 (thirty) days. On the 11th  of every month   ferrous sulfate 325 (65 FE) MG tablet Take 325 mg by mouth 2 (two) times daily.   fluticasone 50 MCG/ACT nasal spray Commonly known as:  FLONASE Place 1 spray into both nostrils daily.   furosemide 20 MG tablet Commonly known as:  LASIX Take 30 mg by mouth daily.   liver oil-zinc oxide 40 % ointment Commonly known as:  DESITIN Apply 1 application topically as needed for irritation.   liver oil-zinc oxide 40 % ointment Commonly known as:  DESITIN Apply 1 application topically 2 (two) times  daily.   Melatonin 3 MG Tabs Take 3 mg by mouth at bedtime.   metFORMIN 500 MG tablet Commonly known as:  GLUCOPHAGE Take 1,000 mg by mouth 2 (two) times daily with a meal.   metoprolol tartrate 25 MG tablet Commonly known as:  LOPRESSOR Take 25 mg by mouth daily.   MINTOX 200-200-20 MG/5ML suspension Generic drug:  alum & mag hydroxide-simeth Take 30 mLs by mouth as needed for indigestion or heartburn (Do not exceed 4 doses in 24 hours).   pregabalin 50 MG capsule Commonly known as:  LYRICA Take 50 mg by mouth 3 (three) times daily.   ROBAFEN 100 MG/5ML syrup Generic drug:  guaifenesin Take 200 mg by mouth every 6 (six) hours as needed for cough (Do not exceed 4 doses in 24 hours).   Relevant Imaging Results: Relevant Lab Results: Additional Information  (SSN: [id number])  Mayco Walrond, Darleen Crocker, LCSW

## 2017-02-05 NOTE — Progress Notes (Signed)
Patient is medically stable for D/C back to [facility] today with new oxygen. RN case manager to arrange home oxygen. [facility] to [facility] today with oxygen and DYS 2 diet. RN will call report and EMS for transport once oxygen is delivered to [facility]. CSW contacted patient's brother Aurther Lofterry and made him aware of above. Please reconsult if future social work needs arise. CSW signing off.   Baker Hughes IncorporatedBailey Lynnea Vandervoort, LCSW [phone]

## 2017-02-05 NOTE — Progress Notes (Signed)
SATURATION QUALIFICATIONS: (This note is used to comply with regulatory documentation for home oxygen)  Patient Saturations on Room Air at Rest = 85    Patient Saturations on 2 Liters of oxygen while Ambulating = 94%  Please briefly explain why patient needs home oxygen:

## 2017-02-05 NOTE — Progress Notes (Signed)
Patient is being discharged to [facility], report given to receiving facility, ems was called waiting for transport

## 2017-02-05 NOTE — Progress Notes (Signed)
Speech Language Pathology Treatment: Dysphagia  Patient Details Name: Malik Bruce MRN: [id number] DOB: [birth year] Today's Date: 02/05/2017 Time: 1000-1045 SLP Time Calculation (min) (ACUTE ONLY): 45 min  Assessment / Plan / Recommendation Clinical Impression  Pt seen today for toleration of diet and trials to upgrade diet as appropriate. Pt awake, nodded during engagement and attempted verbal communication but Aphasia(expressive deficits w/ potential Apraxia?) significantly impacted intelligibility and functioning of verbal communication; gestures attempted w/ some success. Pt has tolerated the modified dysphagia diet per NSG report.  Pt was positioned upright and given trials of Nectar liquids via cup/straw and softened solids for a Level 2 dysphagia diet. Pt consumed trials w/ no overt s/s of aspiration noted; no decline in respiratory status apparent. However, during the oral phase, pt exhibited slower mastication and min increased effort in lingual/oral movements during bolus management and clearing. Given time, pt was able to complete swallow and oral clearing appropriately; alternating food/liquid boluses intermittently appeared to aid as well. Pt required full feeding support d/t RUE weakness/rigidity. Pt's presentation appears appropriate for diet upgrade to a dysphagia level 2 w/ NECTAR liquids via cup/straw; aspiration precautions; Pills in Puree for safer swallowing; feeding support at all meals for monitoring of follow through w/ precautions and oral clearing as well to reduce risk for choking on foods (from not clearing mouth b/t bites adequately). ST services are recommended as f/u once pt returns to SNF w/ MBSS recommended b/f any diet upgrade d/t pt's baseline moderate-severe oropharyngeal phase dysphagia from 07/2016. NSG and MD updated.    HPI HPI: Pt  is a 66 y.o. male who comes in by ambulance from LTC. He was sent for breathing difficulties. EMS states that his O2 sats are 85%  on room air. EMS states that the patient was left in his room eating on his own. They were concerned that he might have choked and could've aspirated. The patient said he did become a little chilled. His shortness of breath started this evening. The patient states that he's had a mild cough with no productive him. The patient denies any chest pains or shortness of breath. He states that he does feel a little dizzy. The patient denies any nausea or vomiting as well. The patient does have some difficulty speaking but he does have a history of a stroke in 07/2016; he is able to follow along w/ instruction w/ verbal cues/direction. The patient does take Lasix so there is a question of if he has a history of congestive heart failure. Of note, pt had a MBSS in 07/2016 which revealed Pt demonstrates a moderate to severe oral and oropharyngeal dysphagia. Lingual weakness results in impared manipulation and formation of bolus with premature spillage, pumping mechanism and oral residuals. Oropharyngeal phase characterzied by delayed swallow initiation leading to aspiration before the swallow with thin liquid and large nectar thick boluses; sensation only with large quantity of aspiration. There is also significant base of tongue weakenss with mdoerate vallcuelar residuals. Also impacting pts function is difficulty following instructions due to aphasia and impulsivity and decreased awareness with self feeding. SLP had to provide max assist to decrease bolus size, resorting finally to spoon feeding. Pt also need max visual and tactile assist to attempt positional strategies (chin tuck, head turn/tilt - not effective in reducing residue). Pt also could not achieve a consistent second swallow or effortful swallo with commands and volitional cough was very weak. Pt will be at risk of aspiration with all PO consistencies, but best  conservative consistency to test for toelrance is dys 1 (puree) with nectar thick liquids via teaspoon.        SLP Plan  Continue with current plan of care       Recommendations  Diet recommendations: Dysphagia 2 (fine chop);Nectar-thick liquid Liquids provided via: Cup;Straw Medication Administration: Crushed with puree Supervision: Staff to assist with self feeding;Full supervision/cueing for compensatory strategies Compensations: Minimize environmental distractions;Slow rate;Small sips/bites;Lingual sweep for clearance of pocketing;Multiple dry swallows after each bite/sip;Follow solids with liquid Postural Changes and/or Swallow Maneuvers: Seated upright 90 degrees;Upright 30-60 min after meal                General recommendations:  (Dietician f/u) Oral Care Recommendations: Oral care BID;Staff/trained caregiver to provide oral care Follow up Recommendations: Skilled Nursing facility SLP Visit Diagnosis: Dysphagia, oropharyngeal phase (R13.12) Plan: Continue with current plan of care       GO                Malik Som, MS, CCC-SLP Malik Bruce 02/05/2017, 1:58 PM

## 2017-02-05 NOTE — Discharge Summary (Addendum)
[facility] [place] [facility]   PATIENT NAME: Malik Bruce    MR#:  [id number]  DATE OF BIRTH:  Oct 02, 1950  DATE OF ADMISSION:  02/02/2017 ADMITTING PHYSICIAN: Arnaldo Natal, MD  DATE OF DISCHARGE: 02/05/2017  PRIMARY CARE PHYSICIAN: Patient, No Pcp Per    ADMISSION DIAGNOSIS:  Acute pulmonary edema (HCC) [J81.0] Hypoxia [R09.02] Acute on chronic congestive heart failure, unspecified heart failure type (HCC) [I50.9]  DISCHARGE DIAGNOSIS:  Active Problems:   Acute on chronic systolic heart failure (HCC)   SECONDARY DIAGNOSIS:   Past Medical History:  Diagnosis Date  . CVA (cerebral vascular accident) (HCC)   . DM type 2 (diabetes mellitus, type 2) (HCC)   . HTN (hypertension)     HOSPITAL COURSE:   66 year old male with past medical history of previous CVA with resultant right-sided hemiparesis, diabetes, hypertension who presents to the hospital due to shortness of breath and respiratory distress.  1. Acute on chronic respiratory failure-secondary to mild CHF along with aspiration pneumonitis. -Patient was treated with IV diuresis and also given IV antibiotics for his aspiration pneumonitis.  His hypoxia and respiratory status has improved.  He is now being discharged on oral antibiotics with Augmentin for additional 7 days.  He has been afebrile and hemodynamically stable.  He will resume his oral Lasix upon discharge. - pt. Will need to be on O2 and will be discharged on O2.    2. CHF- now acute on chronic systolic Dysfunction.  -Previously patient's ejection fraction was 55-60%, echocardiogram on this admission showing mild LV dysfunction with EF of 45-50%.  Patient was given intermittent IV Lasix while in the hospital and has diuresed well and feels better. -Cardiology was consulted who agreed with this management.  Patient will continue his low-dose Lasix upon discharge  3. Aspiration pneumonia/pneumonitis-patient was on IV Zosyn  initially and then switched over to IV Unasyn.  He is afebrile and hemodynamically stable.  Now being discharged on oral Augmentin. - Patient was seen by speech therapy and his diet has been changed accordingly. -Patient presently will be discharged on Dysphagia level 2(minced foods); NECTAR consistency liquids. Aspiration precautions; Pills in Puree. Feeding Assistance at all meals d/t RUE weakness. Speech therapy should continue to follow the patient at the assisted living.  4. History of previous CVA- he will continue Plavix, statin.  5. Diabetes type 2 without complication- while in the hospital pt. Was on SSI and now being discharged back on Oral Metformin.   6. Diabetic neuropathy- pt. Will continue lyrica.   Discharge back to Assisted Living today.   DISCHARGE CONDITIONS:   Stable.   CONSULTS OBTAINED:  Treatment Team:  Antonieta Iba, MD  DRUG ALLERGIES:  No Known Allergies  DISCHARGE MEDICATIONS:   Allergies as of 02/05/2017   No Known Allergies     Medication List    STOP taking these medications   cefUROXime 500 MG tablet Commonly known as:  CEFTIN     TAKE these medications   acetaminophen 500 MG tablet Commonly known as:  TYLENOL Take 500 mg by mouth every 4 (four) hours as needed for mild pain, fever or headache.   acetaminophen 500 MG tablet Commonly known as:  TYLENOL Take 1,000 mg by mouth 2 (two) times daily.   amoxicillin-clavulanate 875-125 MG tablet Commonly known as:  AUGMENTIN Take 1 tablet by mouth 2 (two) times daily.   atorvastatin 40 MG tablet Commonly known as:  LIPITOR Take 40 mg by mouth daily.  b complex vitamins tablet Take 1 tablet by mouth daily.   baclofen 10 MG tablet Commonly known as:  LIORESAL Take 5 mg by mouth 3 (three) times daily.   clopidogrel 75 MG tablet Commonly known as:  PLAVIX Take 1 tablet (75 mg total) by mouth daily.   cyanocobalamin 1000 MCG/ML injection Commonly known as:  (VITAMIN  B-12) Inject 1,000 mcg into the muscle every 30 (thirty) days. On the 11th of every month   ferrous sulfate 325 (65 FE) MG tablet Take 325 mg by mouth 2 (two) times daily.   fluticasone 50 MCG/ACT nasal spray Commonly known as:  FLONASE Place 1 spray into both nostrils daily.   furosemide 20 MG tablet Commonly known as:  LASIX Take 30 mg by mouth daily.   liver oil-zinc oxide 40 % ointment Commonly known as:  DESITIN Apply 1 application topically as needed for irritation.   liver oil-zinc oxide 40 % ointment Commonly known as:  DESITIN Apply 1 application topically 2 (two) times daily.   Melatonin 3 MG Tabs Take 3 mg by mouth at bedtime.   metFORMIN 500 MG tablet Commonly known as:  GLUCOPHAGE Take 1,000 mg by mouth 2 (two) times daily with a meal.   metoprolol tartrate 25 MG tablet Commonly known as:  LOPRESSOR Take 25 mg by mouth daily.   MINTOX 200-200-20 MG/5ML suspension Generic drug:  alum & mag hydroxide-simeth Take 30 mLs by mouth as needed for indigestion or heartburn (Do not exceed 4 doses in 24 hours).   pregabalin 50 MG capsule Commonly known as:  LYRICA Take 50 mg by mouth 3 (three) times daily.   ROBAFEN 100 MG/5ML syrup Generic drug:  guaifenesin Take 200 mg by mouth every 6 (six) hours as needed for cough (Do not exceed 4 doses in 24 hours).         DISCHARGE INSTRUCTIONS:   DIET:  Cardiac diet and Diabetic diet  Dysphagia level 2(minced foods); NECTAR consistency liquids. Aspiration precautions; Pills in Puree. Feeding Assistance at all meals d/t RUE weakness.  DISCHARGE CONDITION:  Stable  ACTIVITY:  Activity as tolerated  OXYGEN:  Home Oxygen: No.   Oxygen Delivery: 2L [place] continuous.   DISCHARGE LOCATION:  Assisted Living    If you experience worsening of your admission symptoms, develop shortness of breath, life threatening emergency, suicidal or homicidal thoughts you must seek medical attention immediately by calling 911 or  calling your MD immediately  if symptoms less severe.  You Must read complete instructions/literature along with all the possible adverse reactions/side effects for all the Medicines you take and that have been prescribed to you. Take any new Medicines after you have completely understood and accpet all the possible adverse reactions/side effects.   Please note  You were cared for by a hospitalist during your hospital stay. If you have any questions about your discharge medications or the care you received while you were in the hospital after you are discharged, you can call the unit and asked to speak with the hospitalist on call if the hospitalist that took care of you is not available. Once you are discharged, your primary care physician will handle any further medical issues. Please note that NO REFILLS for any discharge medications will be authorized once you are discharged, as it is imperative that you return to your primary care physician (or establish a relationship with a primary care physician if you do not have one) for your aftercare needs so that they can reassess  your need for medications and monitor your lab values.     Today   No acute events overnight. Resp. Status stable. Afebrile, Hemodynamically stable. Will d/c back to assisted Living today.   VITAL SIGNS:  Blood pressure 126/60, pulse 82, temperature 98 F (36.7 C), temperature source Oral, resp. rate 18, height 5\' 10"  (1.778 m), weight 78.9 kg (174 lb), SpO2 94 %.  I/O:  No intake or output data in the 24 hours ending 02/05/17 1059  PHYSICAL EXAMINATION:   GENERAL:  66 y.o.-year-old patient lying in bed in no acute distress.  EYES: Pupils equal, round, reactive to light and accommodation. No scleral icterus. Extraocular muscles intact.  HEENT: Head atraumatic, normocephalic. Oropharynx and nasopharynx clear.  NECK:  Supple, no jugular venous distention. No thyroid enlargement, no tenderness.  LUNGS: Poor respiratory  effort, no wheezing, rales, rhonchi. No use of accessory muscles of respiration.  CARDIOVASCULAR: S1, S2 normal. No murmurs, rubs, or gallops.  ABDOMEN: Soft, nontender, nondistended. Bowel sounds present. No organomegaly or mass.  EXTREMITIES: No cyanosis, clubbing or edema b/l.    NEUROLOGIC: Cranial nerves II through XII are intact. No focal Motor or sensory deficits b/l. Right-sided hemiparesis from previous CVA  PSYCHIATRIC: The patient is alert and oriented x 1.  SKIN: No obvious rash, lesion, or ulcer.    DATA REVIEW:   CBC  Recent Labs Lab 02/05/17 0436  WBC 8.0  HGB 9.0*  HCT 28.8*  PLT 307    Chemistries   Recent Labs Lab 02/05/17 0436  NA 142  K 4.0  CL 112*  CO2 27  GLUCOSE 100*  BUN 32*  CREATININE 1.55*  CALCIUM 8.3*    Cardiac Enzymes  Recent Labs Lab 02/02/17 2340  TROPONINI <0.03    Microbiology Results  Results for orders placed or performed during the hospital encounter of 02/02/17  MRSA PCR Screening     Status: Abnormal   Collection Time: 02/03/17  4:24 AM  Result Value Ref Range Status   MRSA by PCR POSITIVE (A) NEGATIVE Final    Comment:        The GeneXpert MRSA Assay (FDA approved for NASAL specimens only), is one component of a comprehensive MRSA colonization surveillance program. It is not intended to diagnose MRSA infection nor to guide or monitor treatment for MRSA infections. RESULT CALLED TO, READ BACK BY AND VERIFIED WITH: VANESSA MACROHAN AT [id number] ON 02/03/17 [facility].     RADIOLOGY:  No results found.    Management plans discussed with the patient, family and they are in agreement.  CODE STATUS:     Code Status Orders        Start     Ordered   02/03/17 0415  Full code  Continuous     02/03/17 0414    TOTAL TIME TAKING CARE OF THIS PATIENT: 40 minutes.    Houston SirenSAINANI,Cypher Paule J M.D on 02/05/2017 at 10:59 AM  Between 7am to 6pm - Pager - [phone]  After 6pm go to [email or web address] - Air traffic controllerpassword [email or web address]  [facility]  [facility] [place] [facility]  Office  [phone]  CC: Primary care physician; Patient, No Pcp Per

## 2017-03-08 ENCOUNTER — Inpatient Hospital Stay
Admission: EM | Admit: 2017-03-08 | Discharge: 2017-03-13 | DRG: 871 | Disposition: E | Payer: Medicare Other | Attending: Internal Medicine | Admitting: Internal Medicine

## 2017-03-08 ENCOUNTER — Encounter: Payer: Self-pay | Admitting: *Deleted

## 2017-03-08 ENCOUNTER — Inpatient Hospital Stay: Payer: Medicare Other

## 2017-03-08 ENCOUNTER — Emergency Department: Payer: Medicare Other

## 2017-03-08 ENCOUNTER — Other Ambulatory Visit: Payer: Self-pay

## 2017-03-08 DIAGNOSIS — E861 Hypovolemia: Secondary | ICD-10-CM | POA: Diagnosis present

## 2017-03-08 DIAGNOSIS — Y95 Nosocomial condition: Secondary | ICD-10-CM | POA: Diagnosis present

## 2017-03-08 DIAGNOSIS — Z7902 Long term (current) use of antithrombotics/antiplatelets: Secondary | ICD-10-CM | POA: Diagnosis not present

## 2017-03-08 DIAGNOSIS — J189 Pneumonia, unspecified organism: Secondary | ICD-10-CM | POA: Diagnosis present

## 2017-03-08 DIAGNOSIS — Z7984 Long term (current) use of oral hypoglycemic drugs: Secondary | ICD-10-CM | POA: Diagnosis not present

## 2017-03-08 DIAGNOSIS — I5023 Acute on chronic systolic (congestive) heart failure: Secondary | ICD-10-CM | POA: Diagnosis not present

## 2017-03-08 DIAGNOSIS — Z452 Encounter for adjustment and management of vascular access device: Secondary | ICD-10-CM

## 2017-03-08 DIAGNOSIS — I4891 Unspecified atrial fibrillation: Secondary | ICD-10-CM | POA: Diagnosis present

## 2017-03-08 DIAGNOSIS — E1122 Type 2 diabetes mellitus with diabetic chronic kidney disease: Secondary | ICD-10-CM | POA: Diagnosis present

## 2017-03-08 DIAGNOSIS — I69351 Hemiplegia and hemiparesis following cerebral infarction affecting right dominant side: Secondary | ICD-10-CM

## 2017-03-08 DIAGNOSIS — R570 Cardiogenic shock: Secondary | ICD-10-CM | POA: Diagnosis present

## 2017-03-08 DIAGNOSIS — R0902 Hypoxemia: Secondary | ICD-10-CM | POA: Diagnosis not present

## 2017-03-08 DIAGNOSIS — Z7951 Long term (current) use of inhaled steroids: Secondary | ICD-10-CM

## 2017-03-08 DIAGNOSIS — J9601 Acute respiratory failure with hypoxia: Secondary | ICD-10-CM | POA: Diagnosis not present

## 2017-03-08 DIAGNOSIS — J810 Acute pulmonary edema: Secondary | ICD-10-CM | POA: Diagnosis present

## 2017-03-08 DIAGNOSIS — N170 Acute kidney failure with tubular necrosis: Secondary | ICD-10-CM | POA: Diagnosis present

## 2017-03-08 DIAGNOSIS — D649 Anemia, unspecified: Secondary | ICD-10-CM | POA: Diagnosis not present

## 2017-03-08 DIAGNOSIS — R6521 Severe sepsis with septic shock: Secondary | ICD-10-CM | POA: Diagnosis not present

## 2017-03-08 DIAGNOSIS — Z515 Encounter for palliative care: Secondary | ICD-10-CM | POA: Diagnosis present

## 2017-03-08 DIAGNOSIS — Z9981 Dependence on supplemental oxygen: Secondary | ICD-10-CM

## 2017-03-08 DIAGNOSIS — Z87891 Personal history of nicotine dependence: Secondary | ICD-10-CM | POA: Diagnosis not present

## 2017-03-08 DIAGNOSIS — Z0189 Encounter for other specified special examinations: Secondary | ICD-10-CM

## 2017-03-08 DIAGNOSIS — J80 Acute respiratory distress syndrome: Secondary | ICD-10-CM | POA: Diagnosis present

## 2017-03-08 DIAGNOSIS — J690 Pneumonitis due to inhalation of food and vomit: Secondary | ICD-10-CM | POA: Diagnosis present

## 2017-03-08 DIAGNOSIS — I471 Supraventricular tachycardia: Secondary | ICD-10-CM | POA: Diagnosis present

## 2017-03-08 DIAGNOSIS — N184 Chronic kidney disease, stage 4 (severe): Secondary | ICD-10-CM | POA: Diagnosis present

## 2017-03-08 DIAGNOSIS — N189 Chronic kidney disease, unspecified: Secondary | ICD-10-CM | POA: Diagnosis not present

## 2017-03-08 DIAGNOSIS — D62 Acute posthemorrhagic anemia: Secondary | ICD-10-CM | POA: Diagnosis present

## 2017-03-08 DIAGNOSIS — Z66 Do not resuscitate: Secondary | ICD-10-CM | POA: Diagnosis present

## 2017-03-08 DIAGNOSIS — R0603 Acute respiratory distress: Secondary | ICD-10-CM

## 2017-03-08 DIAGNOSIS — Z789 Other specified health status: Secondary | ICD-10-CM

## 2017-03-08 DIAGNOSIS — I130 Hypertensive heart and chronic kidney disease with heart failure and stage 1 through stage 4 chronic kidney disease, or unspecified chronic kidney disease: Secondary | ICD-10-CM | POA: Diagnosis present

## 2017-03-08 DIAGNOSIS — A419 Sepsis, unspecified organism: Principal | ICD-10-CM | POA: Diagnosis present

## 2017-03-08 DIAGNOSIS — N179 Acute kidney failure, unspecified: Secondary | ICD-10-CM | POA: Diagnosis not present

## 2017-03-08 DIAGNOSIS — R579 Shock, unspecified: Secondary | ICD-10-CM | POA: Diagnosis not present

## 2017-03-08 DIAGNOSIS — R0602 Shortness of breath: Secondary | ICD-10-CM | POA: Diagnosis not present

## 2017-03-08 DIAGNOSIS — R7989 Other specified abnormal findings of blood chemistry: Secondary | ICD-10-CM

## 2017-03-08 DIAGNOSIS — R1319 Other dysphagia: Secondary | ICD-10-CM | POA: Diagnosis present

## 2017-03-08 DIAGNOSIS — J9690 Respiratory failure, unspecified, unspecified whether with hypoxia or hypercapnia: Secondary | ICD-10-CM

## 2017-03-08 LAB — CBC
HEMATOCRIT: 21.8 % — AB (ref 40.0–52.0)
HEMATOCRIT: 28.7 % — AB (ref 40.0–52.0)
HEMOGLOBIN: 8.7 g/dL — AB (ref 13.0–18.0)
Hemoglobin: 6.8 g/dL — ABNORMAL LOW (ref 13.0–18.0)
MCH: 24 pg — AB (ref 26.0–34.0)
MCH: 24.5 pg — ABNORMAL LOW (ref 26.0–34.0)
MCHC: 31.2 g/dL — ABNORMAL LOW (ref 32.0–36.0)
MCHC: 30.3 g/dL — AB (ref 32.0–36.0)
MCV: 77 fL — AB (ref 80.0–100.0)
MCV: 80.9 fL (ref 80.0–100.0)
Platelets: 274 10*3/uL (ref 150–440)
Platelets: 391 10*3/uL (ref 150–440)
RBC: 2.83 MIL/uL — AB (ref 4.40–5.90)
RBC: 3.55 MIL/uL — AB (ref 4.40–5.90)
RDW: 18 % — AB (ref 11.5–14.5)
RDW: 17.8 % — ABNORMAL HIGH (ref 11.5–14.5)
WBC: 17.3 10*3/uL — AB (ref 3.8–10.6)
WBC: 38.8 10*3/uL — AB (ref 3.8–10.6)

## 2017-03-08 LAB — BLOOD GAS, ARTERIAL
ACID-BASE DEFICIT: 9.9 mmol/L — AB (ref 0.0–2.0)
BICARBONATE: 21.1 mmol/L (ref 20.0–28.0)
FIO2: 0.8
LHR: 35 {breaths}/min
MECHVT: 350 mL
O2 Saturation: 64 %
PEEP/CPAP: 8 cmH2O
Patient temperature: 37
pCO2 arterial: 80 mmHg (ref 32.0–48.0)
pH, Arterial: 7.03 — CL (ref 7.350–7.450)
pO2, Arterial: 50 mmHg — ABNORMAL LOW (ref 83.0–108.0)

## 2017-03-08 LAB — BASIC METABOLIC PANEL
ANION GAP: 10 (ref 5–15)
Anion gap: 9 (ref 5–15)
BUN: 57 mg/dL — AB (ref 6–20)
BUN: 62 mg/dL — ABNORMAL HIGH (ref 6–20)
CHLORIDE: 113 mmol/L — AB (ref 101–111)
CHLORIDE: 112 mmol/L — AB (ref 101–111)
CO2: 20 mmol/L — AB (ref 22–32)
CO2: 21 mmol/L — AB (ref 22–32)
CREATININE: 1.88 mg/dL — AB (ref 0.61–1.24)
Calcium: 7.9 mg/dL — ABNORMAL LOW (ref 8.9–10.3)
Calcium: 8.2 mg/dL — ABNORMAL LOW (ref 8.9–10.3)
Creatinine, Ser: 2.2 mg/dL — ABNORMAL HIGH (ref 0.61–1.24)
GFR calc Af Amer: 41 mL/min — ABNORMAL LOW (ref >60)
GFR calc non Af Amer: 35 mL/min — ABNORMAL LOW (ref >60)
GFR calc non Af Amer: 29 mL/min — ABNORMAL LOW (ref >60)
GFR, EST AFRICAN AMERICAN: 34 mL/min — AB (ref >60)
Glucose, Bld: 159 mg/dL — ABNORMAL HIGH (ref 65–99)
Glucose, Bld: 117 mg/dL — ABNORMAL HIGH (ref 65–99)
POTASSIUM: 4 mmol/L (ref 3.5–5.1)
POTASSIUM: 4.5 mmol/L (ref 3.5–5.1)
SODIUM: 142 mmol/L (ref 135–145)
SODIUM: 143 mmol/L (ref 135–145)

## 2017-03-08 LAB — URINALYSIS, COMPLETE (UACMP) WITH MICROSCOPIC
Bilirubin Urine: NEGATIVE
GLUCOSE, UA: NEGATIVE mg/dL
HGB URINE DIPSTICK: NEGATIVE
Ketones, ur: NEGATIVE mg/dL
Leukocytes, UA: NEGATIVE
NITRITE: NEGATIVE
PH: 5 (ref 5.0–8.0)
PROTEIN: 100 mg/dL — AB
SPECIFIC GRAVITY, URINE: 1.017 (ref 1.005–1.030)
Squamous Epithelial / LPF: NONE SEEN

## 2017-03-08 LAB — COMPREHENSIVE METABOLIC PANEL
ALK PHOS: 123 U/L (ref 38–126)
ALT: 143 U/L — ABNORMAL HIGH (ref 17–63)
AST: 212 U/L — AB (ref 15–41)
Albumin: 2.8 g/dL — ABNORMAL LOW (ref 3.5–5.0)
Anion gap: 14 (ref 5–15)
BILIRUBIN TOTAL: 1.1 mg/dL (ref 0.3–1.2)
BUN: 57 mg/dL — AB (ref 6–20)
CHLORIDE: 110 mmol/L (ref 101–111)
CO2: 17 mmol/L — ABNORMAL LOW (ref 22–32)
CREATININE: 2.18 mg/dL — AB (ref 0.61–1.24)
Calcium: 8.5 mg/dL — ABNORMAL LOW (ref 8.9–10.3)
GFR calc Af Amer: 34 mL/min — ABNORMAL LOW (ref >60)
GFR, EST NON AFRICAN AMERICAN: 29 mL/min — AB (ref >60)
GLUCOSE: 173 mg/dL — AB (ref 65–99)
POTASSIUM: 4.2 mmol/L (ref 3.5–5.1)
Sodium: 141 mmol/L (ref 135–145)
Total Protein: 7.9 g/dL (ref 6.5–8.1)

## 2017-03-08 LAB — BRAIN NATRIURETIC PEPTIDE: B Natriuretic Peptide: 696 pg/mL — ABNORMAL HIGH (ref 0.0–100.0)

## 2017-03-08 LAB — MRSA PCR SCREENING: MRSA BY PCR: NEGATIVE

## 2017-03-08 LAB — CBC WITH DIFFERENTIAL/PLATELET
Basophils Absolute: 0.1 10*3/uL (ref 0–0.1)
Basophils Relative: 0 %
EOS ABS: 0 10*3/uL (ref 0–0.7)
EOS PCT: 0 %
HCT: 24.7 % — ABNORMAL LOW (ref 40.0–52.0)
Hemoglobin: 7.6 g/dL — ABNORMAL LOW (ref 13.0–18.0)
LYMPHS ABS: 0.4 10*3/uL — AB (ref 1.0–3.6)
LYMPHS PCT: 3 %
MCH: 24 pg — AB (ref 26.0–34.0)
MCHC: 30.8 g/dL — AB (ref 32.0–36.0)
MCV: 77.7 fL — AB (ref 80.0–100.0)
MONO ABS: 1.4 10*3/uL — AB (ref 0.2–1.0)
Monocytes Relative: 8 %
Neutro Abs: 15 10*3/uL — ABNORMAL HIGH (ref 1.4–6.5)
Neutrophils Relative %: 89 %
Platelets: 312 10*3/uL (ref 150–440)
RBC: 3.18 MIL/uL — AB (ref 4.40–5.90)
RDW: 17.9 % — AB (ref 11.5–14.5)
WBC: 16.9 10*3/uL — AB (ref 3.8–10.6)

## 2017-03-08 LAB — GLUCOSE, CAPILLARY
GLUCOSE-CAPILLARY: 100 mg/dL — AB (ref 65–99)
GLUCOSE-CAPILLARY: 107 mg/dL — AB (ref 65–99)
GLUCOSE-CAPILLARY: 150 mg/dL — AB (ref 65–99)
GLUCOSE-CAPILLARY: 163 mg/dL — AB (ref 65–99)
Glucose-Capillary: 76 mg/dL (ref 65–99)

## 2017-03-08 LAB — TRIGLYCERIDES: Triglycerides: 90 mg/dL (ref <150)

## 2017-03-08 LAB — TROPONIN I
Troponin I: 0.06 ng/mL (ref <0.03)
Troponin I: 0.07 ng/mL (ref <0.03)

## 2017-03-08 LAB — ABO/RH: ABO/RH(D): O POS

## 2017-03-08 LAB — LACTIC ACID, PLASMA
LACTIC ACID, VENOUS: 5.2 mmol/L — AB (ref 0.5–1.9)
Lactic Acid, Venous: 1.7 mmol/L (ref 0.5–1.9)

## 2017-03-08 LAB — PROTIME-INR
INR: 1.58
PROTHROMBIN TIME: 18.7 s — AB (ref 11.4–15.2)

## 2017-03-08 LAB — PREPARE RBC (CROSSMATCH)

## 2017-03-08 MED ORDER — ROCURONIUM BROMIDE 50 MG/5ML IV SOLN
50.0000 mg | Freq: Once | INTRAVENOUS | Status: AC
  Administered 2017-03-08: 50 mg via INTRAVENOUS

## 2017-03-08 MED ORDER — FENTANYL CITRATE (PF) 100 MCG/2ML IJ SOLN
INTRAMUSCULAR | Status: AC
  Administered 2017-03-08: 50 ug via INTRAVENOUS
  Filled 2017-03-08: qty 2

## 2017-03-08 MED ORDER — PROPOFOL 1000 MG/100ML IV EMUL
0.0000 ug/kg/min | INTRAVENOUS | Status: DC
  Administered 2017-03-09: 50 ug/kg/min via INTRAVENOUS
  Filled 2017-03-08 (×2): qty 100

## 2017-03-08 MED ORDER — SODIUM CHLORIDE 0.9 % IV BOLUS (SEPSIS)
1000.0000 mL | Freq: Once | INTRAVENOUS | Status: AC
  Administered 2017-03-08: 1000 mL via INTRAVENOUS

## 2017-03-08 MED ORDER — FAMOTIDINE IN NACL 20-0.9 MG/50ML-% IV SOLN
20.0000 mg | Freq: Two times a day (BID) | INTRAVENOUS | Status: DC
  Administered 2017-03-08: 20 mg via INTRAVENOUS
  Filled 2017-03-08: qty 50

## 2017-03-08 MED ORDER — SODIUM CHLORIDE 0.9 % IV BOLUS (SEPSIS)
250.0000 mL | Freq: Once | INTRAVENOUS | Status: AC
  Administered 2017-03-08: 250 mL via INTRAVENOUS

## 2017-03-08 MED ORDER — FERROUS SULFATE 220 (44 FE) MG/5ML PO ELIX
300.0000 mg | ORAL_SOLUTION | Freq: Two times a day (BID) | ORAL | Status: DC
  Administered 2017-03-08 – 2017-03-10 (×4): 300 mg
  Filled 2017-03-08 (×4): qty 6.9

## 2017-03-08 MED ORDER — INSULIN ASPART 100 UNIT/ML ~~LOC~~ SOLN
0.0000 [IU] | SUBCUTANEOUS | Status: DC
  Administered 2017-03-08 – 2017-03-09 (×2): 2 [IU] via SUBCUTANEOUS
  Administered 2017-03-09: 5 [IU] via SUBCUTANEOUS
  Administered 2017-03-09: 2 [IU] via SUBCUTANEOUS
  Administered 2017-03-09: 3 [IU] via SUBCUTANEOUS
  Administered 2017-03-09: 2 [IU] via SUBCUTANEOUS
  Filled 2017-03-08 (×7): qty 1

## 2017-03-08 MED ORDER — DOCUSATE SODIUM 50 MG/5ML PO LIQD: 100.0000 mg | Freq: Two times a day (BID) | ORAL | Status: DC | PRN

## 2017-03-08 MED ORDER — CLOPIDOGREL BISULFATE 75 MG PO TABS: 75.0000 mg | ORAL_TABLET | Freq: Every day | ORAL | Status: DC

## 2017-03-08 MED ORDER — DEXTROSE 5 % IV SOLN
2.0000 g | Freq: Once | INTRAVENOUS | Status: AC
  Administered 2017-03-08: 2 g via INTRAVENOUS
  Filled 2017-03-08: qty 2

## 2017-03-08 MED ORDER — GUAIFENESIN 100 MG/5ML PO SYRP
200.0000 mg | ORAL_SOLUTION | Freq: Four times a day (QID) | ORAL | Status: DC | PRN
  Filled 2017-03-08: qty 10

## 2017-03-08 MED ORDER — FLUTICASONE PROPIONATE 50 MCG/ACT NA SUSP
1.0000 | Freq: Every day | NASAL | Status: DC
  Filled 2017-03-08: qty 16

## 2017-03-08 MED ORDER — MELATONIN 5 MG PO TABS
5.0000 mg | ORAL_TABLET | Freq: Every day | ORAL | Status: DC
  Filled 2017-03-08: qty 1

## 2017-03-08 MED ORDER — PREGABALIN 25 MG PO CAPS: 50.0000 mg | ORAL_CAPSULE | Freq: Three times a day (TID) | ORAL | Status: DC

## 2017-03-08 MED ORDER — LORAZEPAM 2 MG/ML IJ SOLN
INTRAMUSCULAR | Status: AC
  Administered 2017-03-08: 1 mg via INTRAVENOUS
  Filled 2017-03-08: qty 1

## 2017-03-08 MED ORDER — PROPOFOL 1000 MG/100ML IV EMUL
0.0000 ug/kg/min | INTRAVENOUS | Status: DC
  Administered 2017-03-08: 10 ug/kg/min via INTRAVENOUS
  Filled 2017-03-08: qty 100

## 2017-03-08 MED ORDER — PREGABALIN 25 MG PO CAPS
50.0000 mg | ORAL_CAPSULE | Freq: Three times a day (TID) | ORAL | Status: DC
  Administered 2017-03-08: 50 mg
  Filled 2017-03-08: qty 2

## 2017-03-08 MED ORDER — MIDAZOLAM HCL 2 MG/2ML IJ SOLN
2.0000 mg | INTRAMUSCULAR | Status: AC
  Administered 2017-03-09: 2 mg via INTRAVENOUS
  Filled 2017-03-08: qty 2

## 2017-03-08 MED ORDER — SODIUM CHLORIDE 0.9% FLUSH
3.0000 mL | Freq: Two times a day (BID) | INTRAVENOUS | Status: DC
  Administered 2017-03-08 – 2017-03-10 (×5): 3 mL via INTRAVENOUS

## 2017-03-08 MED ORDER — ONDANSETRON HCL 4 MG PO TABS: 4.0000 mg | ORAL_TABLET | Freq: Four times a day (QID) | ORAL | Status: DC | PRN

## 2017-03-08 MED ORDER — ETOMIDATE 2 MG/ML IV SOLN
INTRAVENOUS | Status: AC
  Administered 2017-03-08: 20 mg via INTRAVENOUS
  Filled 2017-03-08: qty 10

## 2017-03-08 MED ORDER — BISACODYL 10 MG RE SUPP: 10.0000 mg | Freq: Every day | RECTAL | Status: DC | PRN

## 2017-03-08 MED ORDER — BACLOFEN 10 MG PO TABS
5.0000 mg | ORAL_TABLET | Freq: Three times a day (TID) | ORAL | Status: DC
  Administered 2017-03-08: 5 mg
  Filled 2017-03-08 (×3): qty 0.5

## 2017-03-08 MED ORDER — METOPROLOL TARTRATE 5 MG/5ML IV SOLN
2.5000 mg | INTRAVENOUS | Status: DC | PRN
  Administered 2017-03-08: 2.5 mg via INTRAVENOUS
  Filled 2017-03-08: qty 5

## 2017-03-08 MED ORDER — SENNOSIDES-DOCUSATE SODIUM 8.6-50 MG PO TABS: 1.0000 | ORAL_TABLET | Freq: Every evening | ORAL | Status: DC | PRN

## 2017-03-08 MED ORDER — CLOPIDOGREL BISULFATE 75 MG PO TABS
75.0000 mg | ORAL_TABLET | Freq: Every day | ORAL | Status: DC
  Administered 2017-03-09 – 2017-03-10 (×2): 75 mg
  Filled 2017-03-08 (×2): qty 1

## 2017-03-08 MED ORDER — IPRATROPIUM-ALBUTEROL 0.5-2.5 (3) MG/3ML IN SOLN
3.0000 mL | Freq: Four times a day (QID) | RESPIRATORY_TRACT | Status: DC
  Administered 2017-03-08 – 2017-03-10 (×8): 3 mL via RESPIRATORY_TRACT
  Filled 2017-03-08 (×8): qty 3

## 2017-03-08 MED ORDER — LORAZEPAM 2 MG/ML IJ SOLN
1.0000 mg | Freq: Once | INTRAMUSCULAR | Status: AC
  Administered 2017-03-08: 1 mg via INTRAVENOUS

## 2017-03-08 MED ORDER — SODIUM CHLORIDE 0.9% FLUSH: 10.0000 mL | INTRAVENOUS | Status: DC | PRN

## 2017-03-08 MED ORDER — METOPROLOL TARTRATE 25 MG PO TABS: 25.0000 mg | ORAL_TABLET | Freq: Every day | ORAL | Status: DC

## 2017-03-08 MED ORDER — FENTANYL CITRATE (PF) 100 MCG/2ML IJ SOLN
50.0000 ug | Freq: Once | INTRAMUSCULAR | Status: AC
  Administered 2017-03-08: 50 ug via INTRAVENOUS

## 2017-03-08 MED ORDER — FAMOTIDINE IN NACL 20-0.9 MG/50ML-% IV SOLN
20.0000 mg | INTRAVENOUS | Status: DC
  Administered 2017-03-09: 20 mg via INTRAVENOUS
  Filled 2017-03-08: qty 50

## 2017-03-08 MED ORDER — SODIUM CHLORIDE 0.9% FLUSH: 3.0000 mL | Freq: Two times a day (BID) | INTRAVENOUS | Status: DC

## 2017-03-08 MED ORDER — SODIUM CHLORIDE 0.9 % IV SOLN
Freq: Once | INTRAVENOUS | Status: AC
  Administered 2017-03-08: 07:00:00 via INTRAVENOUS

## 2017-03-08 MED ORDER — SODIUM CHLORIDE 0.9 % IV SOLN: 250.0000 mL | INTRAVENOUS | Status: DC | PRN

## 2017-03-08 MED ORDER — FENTANYL CITRATE (PF) 100 MCG/2ML IJ SOLN
50.0000 ug | INTRAMUSCULAR | Status: DC | PRN
  Administered 2017-03-08: 50 ug via INTRAVENOUS
  Filled 2017-03-08: qty 2

## 2017-03-08 MED ORDER — METOPROLOL TARTRATE 25 MG PO TABS: 25.0000 mg | ORAL_TABLET | Freq: Two times a day (BID) | ORAL | Status: DC

## 2017-03-08 MED ORDER — FUROSEMIDE 10 MG/ML IJ SOLN
40.0000 mg | Freq: Two times a day (BID) | INTRAMUSCULAR | Status: DC
  Administered 2017-03-08: 40 mg via INTRAVENOUS
  Filled 2017-03-08: qty 4

## 2017-03-08 MED ORDER — NOREPINEPHRINE BITARTRATE 1 MG/ML IV SOLN
0.0000 ug/min | INTRAVENOUS | Status: DC
  Administered 2017-03-08: 2 ug/min via INTRAVENOUS
  Administered 2017-03-09: 30 ug/min via INTRAVENOUS
  Administered 2017-03-09: 37 ug/min via INTRAVENOUS
  Administered 2017-03-10: 40 ug/min via INTRAVENOUS
  Filled 2017-03-08 (×5): qty 16

## 2017-03-08 MED ORDER — VANCOMYCIN HCL IN DEXTROSE 1-5 GM/200ML-% IV SOLN
1000.0000 mg | INTRAVENOUS | Status: DC
  Administered 2017-03-08: 1000 mg via INTRAVENOUS
  Filled 2017-03-08: qty 200

## 2017-03-08 MED ORDER — VANCOMYCIN HCL IN DEXTROSE 1-5 GM/200ML-% IV SOLN
1000.0000 mg | Freq: Once | INTRAVENOUS | Status: AC
  Administered 2017-03-08: 1000 mg via INTRAVENOUS
  Filled 2017-03-08: qty 200

## 2017-03-08 MED ORDER — ETOMIDATE 2 MG/ML IV SOLN
20.0000 mg | Freq: Once | INTRAVENOUS | Status: AC
  Administered 2017-03-08: 20 mg via INTRAVENOUS

## 2017-03-08 MED ORDER — FERROUS SULFATE 325 (65 FE) MG PO TABS: 325.0000 mg | ORAL_TABLET | Freq: Two times a day (BID) | ORAL | Status: DC

## 2017-03-08 MED ORDER — SODIUM CHLORIDE 0.9% FLUSH: 3.0000 mL | INTRAVENOUS | Status: DC | PRN

## 2017-03-08 MED ORDER — BACLOFEN 5 MG HALF TABLET
5.0000 mg | ORAL_TABLET | Freq: Three times a day (TID) | ORAL | Status: DC
  Filled 2017-03-08 (×3): qty 1

## 2017-03-08 MED ORDER — DEXTROSE 5 % IV SOLN
2.0000 g | INTRAVENOUS | Status: DC
  Administered 2017-03-08 – 2017-03-09 (×2): 2 g via INTRAVENOUS
  Filled 2017-03-08 (×3): qty 2

## 2017-03-08 MED ORDER — HYDRALAZINE HCL 20 MG/ML IJ SOLN: 10.0000 mg | INTRAMUSCULAR | Status: DC | PRN

## 2017-03-08 MED ORDER — ONDANSETRON HCL 4 MG/2ML IJ SOLN: 4.0000 mg | Freq: Four times a day (QID) | INTRAMUSCULAR | Status: DC | PRN

## 2017-03-08 MED ORDER — FUROSEMIDE 10 MG/ML IJ SOLN
40.0000 mg | Freq: Once | INTRAMUSCULAR | Status: AC
  Administered 2017-03-08: 40 mg via INTRAVENOUS

## 2017-03-08 MED ORDER — FUROSEMIDE 10 MG/ML IJ SOLN
INTRAMUSCULAR | Status: AC
  Administered 2017-03-08: 40 mg via INTRAVENOUS
  Filled 2017-03-08: qty 4

## 2017-03-08 MED ORDER — HEPARIN SODIUM (PORCINE) 5000 UNIT/ML IJ SOLN
5000.0000 [IU] | Freq: Two times a day (BID) | INTRAMUSCULAR | Status: DC
  Administered 2017-03-08: 5000 [IU] via SUBCUTANEOUS
  Filled 2017-03-08: qty 1

## 2017-03-08 MED ORDER — FENTANYL CITRATE (PF) 100 MCG/2ML IJ SOLN: 50.0000 ug | INTRAMUSCULAR | Status: DC | PRN

## 2017-03-08 MED ORDER — ROCURONIUM BROMIDE 50 MG/5ML IV SOLN
INTRAVENOUS | Status: AC
  Administered 2017-03-08: 50 mg via INTRAVENOUS
  Filled 2017-03-08: qty 1

## 2017-03-08 NOTE — Progress Notes (Signed)
Same day evaluation by hospitalist  66 year old male patient with history of systolic heart failure, CVA, diabetes mellitus, hypertension admitted for increased shortness of breath and low oxygen saturation.  1. Acute hypoxic respiratory failure: Was initially placed on BiPAP but now being intubated due to worsening respiratory status per the CCM 2. Acute on chronic systolic congestive heart failure: We will consult cardiology Echo shows EF of 45-50% 3. Healthcare associated pneumonia: continue cefepime 4. Diabetes mellitus type 2: SSI for now 5. Hypertension: Continue metoprolol for now 6  acute anemia: Unsure etiology, there is no blood from rectum or hemoptysis reported, hemoglobin 6.8.  One packed red blood cell order by CCM team, may need further GI and/or hematology evaluation 7.  Atrial fibrillation: Continue Lopressor for rate control, cardiology consultation   Time spent: 15 mins

## 2017-03-08 NOTE — Clinical Social Work Note (Signed)
CSW has noted that patient is a resident of [facility] and that he is currently intubated. CSW will complete assessment when more appropriate. York SpanielMonica Tryston Gilliam MSW,LCSW [phone]

## 2017-03-08 NOTE — Progress Notes (Signed)
eLink Physician-Brief Progress Note Patient Name: Malik JanskyJames A Vanetten DOB: [birth year] MRN: [id number]   Date of Service  2016/08/08  HPI/Events of Note  NHR with sCHF, admw ith fever, hyopoxia >> bipap, being treated for HCAP  eICU Interventions  WOB ok on bipap Monitor Rpt lactate pending     Intervention Category Evaluation Type: New Patient Evaluation  Rakesh V. Alva 2016/08/08, 4:41 AM

## 2017-03-08 NOTE — H&P (Signed)
[facility] [place] [facility]   PATIENT NAME: Malik Bruce    MR#:  [id number]  DATE OF BIRTH:  [birth year]  DATE OF ADMISSION:  02/28/2017  PRIMARY CARE PHYSICIAN: Patient, No Pcp Per   REQUESTING/REFERRING PHYSICIAN:   CHIEF COMPLAINT:   Chief Complaint  Patient presents with  . Respiratory Distress    HISTORY OF PRESENT ILLNESS: Malik Bruce  is a 66 y.o. male with a known history of CVA, diabetes mellitus, hypertension, systolic heart failure was referred from the facility for difficulty breathing. Patient was found to be in respiratory distress. When EMS picked up the patient she was saturating at 65% on oxygen via nasal cannula at 3 L. Patient was put on BiPAP and stabilized in the emergency room. Patient was recently treated at our hospital for systolic heart failure exacerbation in October 2018. Patient also completed Levaquin and clindamycin antibiotics. Workup in the emergency room showed elevated lactic acid level around 5.2. Chest x-ray showed pulmonary vascular congestion, congestive heart failure and pulmonary edema. No complaints of any chest pain. Has orthopnea and paroxysmal nocturnal dyspnea. Hospitalist service was consulted for further care.  PAST MEDICAL HISTORY:   Past Medical History:  Diagnosis Date  . CVA (cerebral vascular accident) (HCC)   . DM type 2 (diabetes mellitus, type 2) (HCC)   . HTN (hypertension)     PAST SURGICAL HISTORY: History reviewed. No pertinent surgical history.  SOCIAL HISTORY:  Social History   Tobacco Use  . Smoking status: Former Games developermoker  . Smokeless tobacco: Former Engineer, waterUser  Substance Use Topics  . Alcohol use: No    FAMILY HISTORY:  Family History  Problem Relation Age of Onset  . Dementia Mother   . Dementia Father   . CAD Brother     DRUG ALLERGIES: No Known Allergies  REVIEW OF SYSTEMS:   CONSTITUTIONAL: No fever, has weakness.  EYES: No blurred or double vision.  EARS, NOSE, AND  THROAT: No tinnitus or ear pain.  RESPIRATORY: Has cough, shortness of breath, wheezing  No hemoptysis.  CARDIOVASCULAR: No chest pain,  Has orthopnea, edema.  GASTROINTESTINAL: No nausea, vomiting, diarrhea or abdominal pain.  GENITOURINARY: No dysuria, hematuria.  ENDOCRINE: No polyuria, nocturia,  HEMATOLOGY: No anemia, easy bruising or bleeding SKIN: No rash or lesion. MUSCULOSKELETAL: No joint pain or arthritis.   NEUROLOGIC: No tingling, numbness, weakness.  PSYCHIATRY: No anxiety or depression.   MEDICATIONS AT HOME:  Prior to Admission medications   Medication Sig Start Date End Date Taking? Authorizing Provider  acetaminophen (TYLENOL) 500 MG tablet Take 500 mg by mouth every 4 (four) hours as needed for mild pain, fever or headache.     [provider]  acetaminophen (TYLENOL) 500 MG tablet Take 1,000 mg by mouth 2 (two) times daily.    [provider]  alum & mag hydroxide-simeth (MINTOX) 200-200-20 MG/5ML suspension Take 30 mLs by mouth as needed for indigestion or heartburn (Do not exceed 4 doses in 24 hours).    [provider]  atorvastatin (LIPITOR) 40 MG tablet Take 40 mg by mouth daily.    [provider]  b complex vitamins tablet Take 1 tablet by mouth daily.    [provider]  baclofen (LIORESAL) 10 MG tablet Take 5 mg by mouth 3 (three) times daily.     [provider]  clopidogrel (PLAVIX) 75 MG tablet Take 1 tablet (75 mg total) by mouth daily. 08/11/16   Penny PiaVega, [place], MD  cyanocobalamin (,VITAMIN B-12,) 1000 MCG/ML injection Inject 1,000 mcg into the muscle every 30 (thirty) days. On the 11th of every month    [provider]  ferrous sulfate 325 (65 FE) MG tablet Take 325 mg by mouth 2 (two) times daily.    [provider]  fluticasone (FLONASE) 50 MCG/ACT nasal spray Place 1 spray into both nostrils daily.    [provider]  guaifenesin (ROBAFEN) 100 MG/5ML syrup Take 200 mg by  mouth every 6 (six) hours as needed for cough (Do not exceed 4 doses in 24 hours).    [provider]  liver oil-zinc oxide (DESITIN) 40 % ointment Apply 1 application topically as needed for irritation.    [provider]  liver oil-zinc oxide (DESITIN) 40 % ointment Apply 1 application topically 2 (two) times daily.    [provider]  Melatonin 3 MG TABS Take 3 mg by mouth at bedtime.    [provider]  metFORMIN (GLUCOPHAGE) 500 MG tablet Take 1,000 mg by mouth 2 (two) times daily with a meal.    [provider]  metoprolol tartrate (LOPRESSOR) 25 MG tablet Take 25 mg by mouth daily.    [provider]  pregabalin (LYRICA) 50 MG capsule Take 50 mg by mouth 3 (three) times daily.    [provider]      PHYSICAL EXAMINATION:   VITAL SIGNS: Blood pressure 116/62, pulse (!) 103, temperature 99.6 F (37.6 C), temperature source Axillary, resp. rate (!) 24, height 5\' 8"  (1.727 m), weight 70.3 kg (155 lb), SpO2 96 %.  GENERAL:  66 y.o.-year-old patient lying in the bed in mild to moderate respiratory distress.  EYES: Pupils equal, round, reactive to light and accommodation. No scleral icterus. Extraocular muscles intact.  HEENT: Head atraumatic, normocephalic. Oropharynx and nasopharynx clear.  NECK:  Supple, no jugular venous distention. No thyroid enlargement, no tenderness.  LUNGS: Decreased breath sounds bilaterally, bibasilar crepitations heard.  BIPAP IPAP 14 EPAP 6 Rate 8 FIO2 40% CARDIOVASCULAR: S1, S2 normal. No murmurs, rubs, or gallops.  ABDOMEN: Soft, nontender, nondistended. Bowel sounds present. No organomegaly or mass.  EXTREMITIES: Bilateral pedal edema,  No cyanosis, or clubbing.  NEUROLOGIC: Cranial nerves II through XII are intact. Muscle strength 5/5 in all extremities. Sensation intact. Gait not checked.  PSYCHIATRIC: The patient is alert and oriented x 3.  SKIN: No obvious rash, lesion, or ulcer.    LABORATORY PANEL:   CBC Recent Labs  Lab 03/03/2017 0018  WBC 16.9*  HGB 7.6*  HCT 24.7*  PLT 312  MCV 77.7*  MCH 24.0*  MCHC 30.8*  RDW 17.9*  LYMPHSABS 0.4*  MONOABS 1.4*  EOSABS 0.0  BASOSABS 0.1   ------------------------------------------------------------------------------------------------------------------  Chemistries  Recent Labs  Lab 02/15/2017 0018  NA 141  K 4.2  CL 110  CO2 17*  GLUCOSE 173*  BUN 57*  CREATININE 2.18*  CALCIUM 8.5*  AST 212*  ALT 143*  ALKPHOS 123  BILITOT 1.1   ------------------------------------------------------------------------------------------------------------------ estimated creatinine clearance is 31.4 mL/min (A) (by C-G formula based on SCr of 2.18 mg/dL (H)). ------------------------------------------------------------------------------------------------------------------ No results for input(s): TSH, T4TOTAL, T3FREE, THYROIDAB in the last 72 hours.  Invalid input(s): FREET3   Coagulation profile Recent Labs  Lab 02/28/2017 0018  INR 1.58   ------------------------------------------------------------------------------------------------------------------- No results for input(s): DDIMER in the last 72 hours. -------------------------------------------------------------------------------------------------------------------  Cardiac Enzymes No results for input(s): CKMB, TROPONINI, MYOGLOBIN in the last 168 hours.  Invalid input(s): CK ------------------------------------------------------------------------------------------------------------------ Invalid input(s):  POCBNP  ---------------------------------------------------------------------------------------------------------------  Urinalysis    Component Value Date/Time   COLORURINE AMBER (A) 02/28/2017 0018   APPEARANCEUR HAZY (A) 02/21/2017 0018   LABSPEC 1.017 03/02/2017 0018   PHURINE 5.0 03/03/2017 0018   GLUCOSEU NEGATIVE 02/23/2017 0018   HGBUR  NEGATIVE 03/07/2017 0018   BILIRUBINUR NEGATIVE 02/15/2017 0018   KETONESUR NEGATIVE 02/23/2017 0018   PROTEINUR 100 (A) 03/07/2017 0018   NITRITE NEGATIVE 03/05/2017 0018   LEUKOCYTESUR NEGATIVE 02/24/2017 0018     RADIOLOGY: Dg Chest Portable 1 View  Result Date: 02/26/2017 CLINICAL DATA:  Shortness of breath and respiratory distress since this evening. Right-sided weakness. EXAM: PORTABLE CHEST 1 VIEW COMPARISON:  02/02/2017 FINDINGS: Shallow inspiration. Cardiac enlargement. Pulmonary vascular congestion. Diffuse airspace disease throughout the lungs likely representing edema. Appearances are similar to the previous study. No focal consolidation. No blunting of costophrenic angles. No pneumothorax. Degenerative changes in the spine and shoulders. IMPRESSION: Persistent congestive failure with cardiac enlargement, pulmonary vascular congestion, and diffuse edema. Electronically Signed   By: Burman NievesWilliam  Stevens M.D.   On: 02/19/2017 00:41    EKG: Orders placed or performed during the hospital encounter of 03/09/2017  . EKG 12-Lead  . EKG 12-Lead    IMPRESSION AND PLAN: 66 year old male patient with history of systolic heart failure, CVA, diabetes mellitus, hypertension presented to the emergency room with increased shortness of breath and low oxygen saturation.  Admitting diagnosis 1. Acute hypoxic respiratory failure 2. Decompensated congestive heart failure 3. Healthcare associated pneumonia 4. Diabetes mellitus type 2 5. Hypertension 6 anemia Treatment plan Admit patient to stepdown unit Continue BiPAP for respiratory failure Start patient on IV vancomycin and IV cefepime antibiotic Diurese patient with IV Lasix 40 MG every 12 hourly Check echocardiogram Follow-up lactic acid level Monitor hemoglobin and hematocrit DVT prophylaxis subcutaneous heparin  All the records are reviewed and case discussed with ED provider. Management plans discussed with the patient, family and  they are in agreement.  CODE STATUS:FULL CODE Code Status History    Date Active Date Inactive Code Status Order ID Comments User Context   02/03/2017 04:14 02/05/2017 23:29 Full Code [id number]  Arnaldo Nataliamond, Michael S, MD Inpatient   08/06/2016 08:46 08/11/2016 20:13 Full Code [id number]  Clydie BraunSmith, Rondell A, MD Inpatient   08/06/2016 02:27 08/06/2016 06:31 Full Code [id number]  Clydie BraunSmith, Rondell A, MD ED       TOTAL CRITICAL CARE TIME TAKING CARE OF THIS PATIENT: 55 minutes.    Ihor AustinPavan Danniel Grenz M.D on 03/04/2017 at 2:38 AM  Between 7am to 6pm - Pager - [phone]  After 6pm go to [email or web address] - password [email or web address]  [facility] [place] [facility]  Office  [phone]  CC: Primary care physician; Patient, No Pcp Per

## 2017-03-08 NOTE — Progress Notes (Signed)
Pharmacy Antibiotic Note  Malik Bruce is a 66 y.o. male admitted on 2016/07/10 with pneumonia.  Pharmacy has been consulted for vanc/cefepime dosing.  Plan: Patient received vanc 1g and cefepime 2g IV x 1 in ED  Will continue w/ vanc 1g IV q24h w/ 8 hour stack dosing. Will draw vanc trough 11/29 @ 0900 prior to 4th dose. Will continue cefepime 2g IV daily.  Ke 0.0304  T1/2 24 hrs Goal trough 15 - 20 mcg/mL Css 18.6 mcg/mL  Height: 5\' 8"  (172.7 cm) Weight: 155 lb (70.3 kg) IBW/kg (Calculated) : 68.4  Temp (24hrs), Avg:99.1 F (37.3 C), Min:98.6 F (37 C), Max:99.6 F (37.6 C)  Recent Labs  Lab Dec 25, 2016 0018  WBC 16.9*  CREATININE 2.18*  LATICACIDVEN 5.2*    Estimated Creatinine Clearance: 31.4 mL/min (A) (by C-G formula based on SCr of 2.18 mg/dL (H)).    No Known Allergies   Thank you for allowing pharmacy to be a part of this patient's care.  Thomasene Rippleavid Shaye Lagace, PharmD, BCPS Clinical Pharmacist 2016/07/10

## 2017-03-08 NOTE — Progress Notes (Signed)
Patient intubated with 8.0 ETT, 24cm at lip by Dr. Sung AmabileSimonds.  Placement confirmed visually, condensation noted in ETT, positive color change noted on c02 detector, equal bilateral breath sounds.  Chest x-ray pending after central line placement.

## 2017-03-08 NOTE — ED Triage Notes (Signed)
[facility] [place]. Shortness of breath and resp distress since evening. Pt on 3L [place] at facility and sat 65% on EMS arrival. Pt has a hx of CHF and a CVA with Right sided weakness. Pt arrives on CPAP with sat 88% #18 l AC and 1 inch nitro paste. Pt denies chest pain. HR 110 BP 128/56. Resp here to apply Bipap. Pt comfortable at present .O2 sats 93%

## 2017-03-08 NOTE — Progress Notes (Signed)
eLink Physician-Brief Progress Note Patient Name: Malik Bruce DOB: 06-04-1950 MRN: [id number]   Date of Service  02/24/2017  HPI/Events of Note  Hb drop to 6.7 from 7.6 on admit, no obvious source of loss  eICU Interventions  1 U PRBC guiaic     Intervention Category Intermediate Interventions: Bleeding - evaluation and treatment with blood products  Rakesh V. Alva 03/09/2017, 6:20 AM

## 2017-03-08 NOTE — Procedures (Signed)
PROCEDURE NOTE: CVL PLACEMENT  INDICATION:    Monitoring of central venous pressures and/or administration of medications optimally administered in central vein  CONSENT:   Risks of procedure as well as the alternatives were explained to the patient or surrogate. Consent for procedure obtained. A time out was performed.   PROCEDURE  Sterile technique was used including antiseptics, cap, gloves, gown, hand hygiene, mask and full body sheet.  Skin prep: Chlorhexidine; local anesthetic administered  A triple lumen catheter was placed in the R  vein using the Seldinger technique.   EVALUATION:  Blood flow good  Complications: No apparent complications  Patient tolerated the procedure well.  Chest X-ray ordered to verify placement and revealed proper positioning with no pneumothorax   Billy Fischeravid Kordel Leavy, MD PCCM service Mobile [phone]

## 2017-03-08 NOTE — Progress Notes (Signed)
No improvement noted in patient after ativan 1mg  IV and lasix 40mg  IV.  Dr. Sung AmabileSimonds notified.  Orders obtained to prepare for iontubation.

## 2017-03-08 NOTE — Progress Notes (Signed)
RN called and spoke with Dr. Sung AmabileSimonds and made MD aware that patient is having increased work of breathing with respiratory rate 39-41 and fio2 on bipap has been increased from 40-70% since this morning and o2 sats are 88-91%.  Heart rate increased to 120's in and out of afib.  Dr. Sung AmabileSimonds stated he would come see the patient in a little while.

## 2017-03-08 NOTE — Procedures (Signed)
Oral Intubation Procedure Note  Indications: Respiratory insufficiency Consent: Unable to obtain consent because of altered level of consciousness. Time Out: Verified patient identification, verified procedure, site/side was marked, verified correct patient position, special equipment/implants available, medications/allergies/relevent history reviewed, required imaging and test results available.   Pre-meds: Etomidate 20 mg IV  Neuromuscular blockade: Rocuronium 50 mg IV  Laryngoscope: #4 Glidescope  Visualization: cords fully visualized  ETT: 7.5 ETT passed on first attempt and secured @ 24 cm at upper incisors  Findings: Extensive bloody and purulent secretions in upper airway making visualization very difficult   Evaluation:  Tube position confirmed by auscultation and EZCap CXR revealed proper positioning of ETT  Pt tolerated procedure well.  Due to difficult procedure and severity of his lung disease, severe desaturation occurred during the procedure.  He improved after intubation   Billy Fischeravid Alaina Donati, MD PCCM service Mobile [phone] Pager [phone] 03/11/2017 4:28 PM

## 2017-03-08 NOTE — Progress Notes (Signed)
Dr. Sung AmabileSimonds at bedside talking with brother.

## 2017-03-08 NOTE — Progress Notes (Signed)
Notified Dana, NP that scheduled Metoprolol held due to BP 77/56 and Levophed initiated. Acknowledged and no new orders.

## 2017-03-08 NOTE — ED Notes (Signed)
In and out cath for speciment

## 2017-03-08 NOTE — Progress Notes (Signed)
Verified BP parameters with Annabelle Harmanana, NP to keep SBP > 80 and MAP > 65. If BP or MAP remains consistantly below parameters start levophed drip per order.

## 2017-03-08 NOTE — ED Provider Notes (Signed)
[facility] Emergency Department Provider Note   ____________________________________________   First MD Initiated Contact with Patient 2017/03/06 0022     (approximate)  I have reviewed the triage vital signs and the nursing notes.   HISTORY  Chief Complaint Respiratory Distress  History limited by patient respiratory distress  HPI Malik Bruce is a 66 y.o. male with a history of congestive heart failure and CVA who comes into the hospital today with some respiratory distress.  The patient was at the nursing home hypoxic for multiple hours and having respiratory distress.  The patient normally is on 3 L of O2 by nasal cannula but was found by EMS to have a oxygen saturation of 65%.  The patient denies any chest pains or shortness of breath.  He was recently increased to his 3 L on 14 November.  He is also been on Levaquin for the past 10 days and recently started on clindamycin.  The patient was brought in for evaluation of his symptoms.   Past Medical History:  Diagnosis Date  . CVA (cerebral vascular accident) (HCC)   . DM type 2 (diabetes mellitus, type 2) (HCC)   . HTN (hypertension)     Patient Active Problem List   Diagnosis Date Noted  . HCAP (healthcare-associated pneumonia) 01-26-17  . Acute on chronic systolic heart failure (HCC) 02/03/2017  . CVA (cerebral vascular accident) (HCC) 08/06/2016  . Acute metabolic encephalopathy 08/06/2016  . Leukocytosis 08/06/2016  . Anemia due to chronic kidney disease 08/06/2016  . Renal insufficiency 08/06/2016  . Hyperlipidemia 08/06/2016  . CVA, old, hemiparesis (HCC) 08/06/2016    History reviewed. No pertinent surgical history.  Prior to Admission medications   Medication Sig Start Date End Date Taking? Authorizing Provider  acetaminophen (TYLENOL) 500 MG tablet Take 500 mg by mouth every 4 (four) hours as needed for mild pain, fever or headache.     [provider]  acetaminophen  (TYLENOL) 500 MG tablet Take 1,000 mg by mouth 2 (two) times daily.    [provider]  alum & mag hydroxide-simeth (MINTOX) 200-200-20 MG/5ML suspension Take 30 mLs by mouth as needed for indigestion or heartburn (Do not exceed 4 doses in 24 hours).    [provider]  atorvastatin (LIPITOR) 40 MG tablet Take 40 mg by mouth daily.    [provider]  b complex vitamins tablet Take 1 tablet by mouth daily.    [provider]  baclofen (LIORESAL) 10 MG tablet Take 5 mg by mouth 3 (three) times daily.     [provider]  clopidogrel (PLAVIX) 75 MG tablet Take 1 tablet (75 mg total) by mouth daily. 08/11/16   Penny PiaVega, [place], MD  cyanocobalamin (,VITAMIN B-12,) 1000 MCG/ML injection Inject 1,000 mcg into the muscle every 30 (thirty) days. On the 11th of every month    [provider]  ferrous sulfate 325 (65 FE) MG tablet Take 325 mg by mouth 2 (two) times daily.    [provider]  fluticasone (FLONASE) 50 MCG/ACT nasal spray Place 1 spray into both nostrils daily.    [provider]  guaifenesin (ROBAFEN) 100 MG/5ML syrup Take 200 mg by mouth every 6 (six) hours as needed for cough (Do not exceed 4 doses in 24 hours).    [provider]  liver oil-zinc oxide (DESITIN) 40 % ointment Apply 1 application topically as needed for irritation.    [provider]  liver oil-zinc oxide (DESITIN) 40 %  ointment Apply 1 application topically 2 (two) times daily.    [provider]  Melatonin 3 MG TABS Take 3 mg by mouth at bedtime.    [provider]  metFORMIN (GLUCOPHAGE) 500 MG tablet Take 1,000 mg by mouth 2 (two) times daily with a meal.    [provider]  metoprolol tartrate (LOPRESSOR) 25 MG tablet Take 25 mg by mouth daily.    [provider]  pregabalin (LYRICA) 50 MG capsule Take 50 mg by mouth 3 (three) times daily.    [provider]    Allergies Patient has no known  allergies.  Family History  Problem Relation Age of Onset  . Dementia Mother   . Dementia Father   . CAD Brother     Social History Social History   Tobacco Use  . Smoking status: Former Games developer  . Smokeless tobacco: Former Engineer, water Use Topics  . Alcohol use: No  . Drug use: No    Review of Systems  Constitutional: No fever/chills Eyes: No visual changes. ENT: No sore throat. Cardiovascular: Denies chest pain. Respiratory:  shortness of breath. Gastrointestinal: No abdominal pain.  No nausea, no vomiting.  No diarrhea.  No constipation. Genitourinary: Negative for dysuria. Musculoskeletal: Negative for back pain. Skin: Negative for rash. Neurological: Negative for headaches, focal weakness or numbness.   ____________________________________________   PHYSICAL EXAM:  VITAL SIGNS: ED Triage Vitals  Enc Vitals Group     BP 03/11/17 0030 116/62     Pulse Rate 11-Mar-2017 0030 (!) 102     Resp 03/11/17 0030 (!) 25     Temp 03/11/17 0033 99.6 F (37.6 C)     Temp Source 03-11-17 0033 Axillary     SpO2 2017-03-11 0030 90 %     Weight 03-11-17 0033 155 lb (70.3 kg)     Height 03/11/2017 0033 5\' 8"  (1.727 m)     Head Circumference --      Peak Flow --      Pain Score --      Pain Loc --      Pain Edu? --      Excl. in GC? --     Constitutional: Alert and oriented. Ill appearing and in respiratory distress. Eyes: Conjunctivae are normal. PERRL. EOMI. Head: Atraumatic. Nose: No congestion/rhinnorhea. Mouth/Throat: Mucous membranes are moist.  Oropharynx non-erythematous. Cardiovascular: Tachycardia, regular rhythm. Grossly normal heart sounds.  Good peripheral circulation. Respiratory: Increased respiratory effort.  No retractions.diminished expiratory breath sounds. Gastrointestinal: Soft and nontender. No distention. Positive bowel sounds Musculoskeletal: No lower extremity tenderness nor edema.   Neurologic:  Normal speech and language.  Right-sided weakness  from previous stroke. Skin:  Skin is warm, dry and intact.  Psychiatric: Mood and affect are normal.   ____________________________________________   LABS (all labs ordered are listed, but only abnormal results are displayed)  Labs Reviewed  LACTIC ACID, PLASMA - Abnormal; Notable for the following components:      Result Value   Lactic Acid, Venous 5.2 (*)    All other components within normal limits  COMPREHENSIVE METABOLIC PANEL - Abnormal; Notable for the following components:   CO2 17 (*)    Glucose, Bld 173 (*)    BUN 57 (*)    Creatinine, Ser 2.18 (*)    Calcium 8.5 (*)    Albumin 2.8 (*)    AST 212 (*)    ALT 143 (*)    GFR calc non Af Amer 29 (*)  GFR calc Af Amer 34 (*)    All other components within normal limits  CBC WITH DIFFERENTIAL/PLATELET - Abnormal; Notable for the following components:   WBC 16.9 (*)    RBC 3.18 (*)    Hemoglobin 7.6 (*)    HCT 24.7 (*)    MCV 77.7 (*)    MCH 24.0 (*)    MCHC 30.8 (*)    RDW 17.9 (*)    Neutro Abs 15.0 (*)    Lymphs Abs 0.4 (*)    Monocytes Absolute 1.4 (*)    All other components within normal limits  PROTIME-INR - Abnormal; Notable for the following components:   Prothrombin Time 18.7 (*)    All other components within normal limits  URINALYSIS, COMPLETE (UACMP) WITH MICROSCOPIC - Abnormal; Notable for the following components:   Color, Urine AMBER (*)    APPearance HAZY (*)    Protein, ur 100 (*)    Bacteria, UA RARE (*)    All other components within normal limits  BRAIN NATRIURETIC PEPTIDE - Abnormal; Notable for the following components:   B Natriuretic Peptide 696.0 (*)    All other components within normal limits  CULTURE, BLOOD (ROUTINE X 2)  CULTURE, BLOOD (ROUTINE X 2)  LACTIC ACID, PLASMA  TYPE AND SCREEN   ____________________________________________  EKG  ED ECG REPORT I, Rebecka Apley, the attending physician, personally viewed and interpreted this ECG.   Date: March 13, 2017  EKG  Time: 208  Rate: 99  Rhythm: normal sinus rhythm  Axis: normal  Intervals:prolonged qtc  ST&T Change: none  ____________________________________________  RADIOLOGY  Dg Chest Portable 1 View  Result Date: 03/13/2017 CLINICAL DATA:  Shortness of breath and respiratory distress since this evening. Right-sided weakness. EXAM: PORTABLE CHEST 1 VIEW COMPARISON:  02/02/2017 FINDINGS: Shallow inspiration. Cardiac enlargement. Pulmonary vascular congestion. Diffuse airspace disease throughout the lungs likely representing edema. Appearances are similar to the previous study. No focal consolidation. No blunting of costophrenic angles. No pneumothorax. Degenerative changes in the spine and shoulders. IMPRESSION: Persistent congestive failure with cardiac enlargement, pulmonary vascular congestion, and diffuse edema. Electronically Signed   By: Burman Nieves M.D.   On: 13-Mar-2017 00:41    ____________________________________________   PROCEDURES  Procedure(s) performed: None  .Critical Care Performed by: Rebecka Apley, MD Authorized by: Rebecka Apley, MD   Critical care provider statement:    Critical care time (minutes):  30   Critical care start time:  13-Mar-2017 12:22 AM   Critical care end time:  2017/03/13 12:52 AM   Critical care time was exclusive of:  Separately billable procedures and treating other patients   Critical care was necessary to treat or prevent imminent or life-threatening deterioration of the following conditions:  Respiratory failure and sepsis   Critical care was time spent personally by me on the following activities:  Discussions with consultants, evaluation of patient's response to treatment, examination of patient, pulse oximetry, re-evaluation of patient's condition, review of old charts, ordering and review of laboratory studies and ordering and review of radiographic studies   I assumed direction of critical care for this patient from another  provider in my specialty: no      Critical Care performed: Yes, see critical care note(s)  ____________________________________________   INITIAL IMPRESSION / ASSESSMENT AND PLAN / ED COURSE  As part of my medical decision making, I reviewed the following data within the electronic MEDICAL RECORD NUMBER Notes from prior ED visits and [place] Controlled Substance Database  This is a 66 year old male who comes into the hospital today with some shortness of breath.  When I came into the room the patient's oxygen saturation was in the 70s.  He was placed on BiPAP immediately with some improvement in his oxygenation.  That he seemed diminished throughout but had no auditory wheezes  I did perform a chest x-ray on the patient and while it did show pulmonary edema it was found that the patient's lactic acid was 5.2.  He is also been on multiple rounds of antibiotics for what I presumed was pneumonia.  The patient was also found to have a white blood cell count that was elevated at 16.9 with a hemoglobin of 7.6 and a hematocrit of 24.7.  The patient's creatinine is also elevated at 2.18 from baseline.  Given these findings and the patient's respiratory distress I decided to treat the patient with some vancomycin and cefepime for nosocomial acquired pneumonia.  The patient was admitted to the hospital for heart failure exacerbation in October.  On the BiPAP the patient's breathing did improve.  When I did go back to reassess the patient he had much improved oxygenation from previous.  I did discuss the case with the hospitalist and they will admit the patient to the intensive care unit.      ____________________________________________   FINAL CLINICAL IMPRESSION(S) / ED DIAGNOSES  Final diagnoses:  Hypoxia  Respiratory distress  Acute pulmonary edema (HCC)  Elevated lactic acid level     ED Discharge Orders    None       Note:  This document was prepared using Dragon voice recognition  software and may include unintentional dictation errors.    Rebecka ApleyWebster, Allison P, MD September 24, 2016 [phone]

## 2017-03-08 NOTE — Progress Notes (Signed)
Dr. Sung AmabileSimonds called due to patient being hypoxic, tachycardic, tachypnic, anxious, with increased work of breathing.   Patient currently on BiPAP fi02 has been increased to 90% with patient sats of 85-90%.

## 2017-03-08 NOTE — Consult Note (Signed)
PULMONARY / CRITICAL CARE MEDICINE   Name: Malik Bruce MRN: [id number] DOB: 1950/08/30    ADMISSION DATE:  01-Aug-2016  PT PROFILE:   [age] M SNF resident with hx of multiple CVAs, chronic dysphagia, CHF, DM2, Htn,  admitted to ICU/SDU 11/26 with respiratory distress and hypoxemia.  Initial CXR consistent with pulmonary edema.  Also concern for aspiration pneumonitis.  Patient required noninvasive ventilation on admission and therefore was admitted to ICU/SDU with request for PCCM consultation  MAJOR EVENTS/TEST RESULTS: 11/26 admission as above 11/26 progressive respiratory distress and hypoxemia despite NIPPV.  Intubated -difficult airway requiring glide scope.  New onset AF with RVR.  Labile BPs  INDWELLING DEVICES:: ETT 11/26 >>  R [place] CVL 11/26 >>   MICRO DATA: MRSA PCR 11/26 >> NEG Resp 11/26 >>  Blood 11/26 >>   ANTIMICROBIALS:  Vanc 11/26 >> 11/26 Cefepime 11/26 >>     PAST MEDICAL HISTORY :  He  has a past medical history of CVA (cerebral vascular accident) (HCC), DM type 2 (diabetes mellitus, type 2) (HCC), and HTN (hypertension).  PAST SURGICAL HISTORY: He  has no past surgical history on file.  No Known Allergies  No current facility-administered medications on file prior to encounter.    Current Outpatient Medications on File Prior to Encounter  Medication Sig  . acetaminophen (TYLENOL) 500 MG tablet Take 500-1,000 mg by mouth 2 (two) times daily as needed. TAKE 2 TABLETS BY MOUTH TWICE DAILY AND 1 TABLET BY MOUTH EVERY 4 HOURS AS NEEDED  . atorvastatin (LIPITOR) 40 MG tablet Take 40 mg by mouth daily.  Marland Kitchen. b complex vitamins tablet Take 1 tablet by mouth daily.  . baclofen (LIORESAL) 10 MG tablet Take 5 mg by mouth 3 (three) times daily.   . clindamycin (CLEOCIN) 300 MG capsule Take 300 mg by mouth 2 (two) times daily.  . clopidogrel (PLAVIX) 75 MG tablet Take 1 tablet (75 mg total) by mouth daily.  . ferrous sulfate 325 (65 FE) MG EC tablet Take 325 mg by  mouth 3 (three) times daily.  . fluticasone (FLONASE) 50 MCG/ACT nasal spray Place 1 spray into both nostrils 2 (two) times daily.   Marland Kitchen. liver oil-zinc oxide (DESITIN) 40 % ointment Apply 1 application topically as needed. APPLY 1 APPLICATION TOPICALLY BID AND PRN  . loratadine (CLARITIN) 10 MG tablet Take 10 mg by mouth daily.  . Melatonin 3 MG TABS Take 3 mg by mouth at bedtime.  . metFORMIN (GLUCOPHAGE) 500 MG tablet Take 1,000 mg by mouth 2 (two) times daily with a meal.  . metoprolol tartrate (LOPRESSOR) 25 MG tablet Take 25 mg by mouth daily.  . pregabalin (LYRICA) 50 MG capsule Take 50 mg by mouth 3 (three) times daily.  Marland Kitchen. alum & mag hydroxide-simeth (MINTOX) 200-200-20 MG/5ML suspension Take 30 mLs by mouth as needed for indigestion or heartburn (Do not exceed 4 doses in 24 hours).  . guaifenesin (ROBAFEN) 100 MG/5ML syrup Take 200 mg by mouth every 6 (six) hours as needed for cough (Do not exceed 4 doses in 24 hours).    FAMILY HISTORY:  His indicated that the status of his mother is unknown. He indicated that the status of his father is unknown. He indicated that the status of his brother is unknown.   SOCIAL HISTORY: He  reports that he has quit smoking. He has quit using smokeless tobacco. He reports that he does not drink alcohol or use drugs.  REVIEW OF SYSTEMS:  Level 5 caveat  SUBJECTIVE:    VITAL SIGNS: BP 133/70   Pulse (!) 119   Temp 99.7 F (37.6 C) (Axillary)   Resp (!) 0   Ht 5\' 6"  (1.676 m)   Wt 76.2 kg (167 lb 15.9 oz) Comment: taken on bed scale at 0511  SpO2 95%   BMI 27.11 kg/m   HEMODYNAMICS:    VENTILATOR SETTINGS: FiO2 (%):  [92 %-100 %] 100 %  INTAKE / OUTPUT: I/O last 3 completed shifts: In: 2300 [IV Piggyback:2300] Out: -   PHYSICAL EXAMINATION: General: Chronically ill-appearing, acutely dyspneic, RASS -1 Neuro: Right hemiplegia HEENT: NCAT, sclerae white Cardiovascular: IRIR, tachycardia, no M noted Lungs: Diffuse crackles and  rhonchi with prolonged expiratory phase Abdomen: Soft, NT, diminished BS Ext: Warm, no edema Skin: Leathery skin across upper chest, no acute findings  LABS:  BMET Recent Labs  Lab 02/26/2017 0018 02/20/2017 0508  NA 141 142  K 4.2 4.0  CL 110 113*  CO2 17* 20*  BUN 57* 57*  CREATININE 2.18* 1.88*  GLUCOSE 173* 159*    Electrolytes Recent Labs  Lab 02/16/2017 0018 02/11/2017 0508  CALCIUM 8.5* 7.9*    CBC Recent Labs  Lab 02/15/2017 0018 02/17/2017 0508  WBC 16.9* 17.3*  HGB 7.6* 6.8*  HCT 24.7* 21.8*  PLT 312 274    Coag's Recent Labs  Lab 03/09/2017 0018  INR 1.58    Sepsis Markers Recent Labs  Lab 02/13/2017 0018 02/17/2017 0508  LATICACIDVEN 5.2* 1.7    ABG No results for input(s): PHART, PCO2ART, PO2ART in the last 168 hours.  Liver Enzymes Recent Labs  Lab 03/07/2017 0018  AST 212*  ALT 143*  ALKPHOS 123  BILITOT 1.1  ALBUMIN 2.8*    Cardiac Enzymes Recent Labs  Lab 03/12/2017 0508 03/03/2017 1003  TROPONINI 0.06* 0.05*    Glucose Recent Labs  Lab 02/28/2017 0428 03/03/2017 1203 03/04/2017 1605  GLUCAP 163* 76 150*    CXR: Extensive edema/ARDS pattern    ASSESSMENT / PLAN:  PULMONARY A: Acute hypoxemic respiratory failure Pulmonary edema Chronic aspiration Suspect aspiration pneumonitis ARDS P:   Vent settings established - lung protection strategy Vent bundle implemented Daily SBT as indicated Empiric nebulized bronchodilators  CARDIOVASCULAR A:  Hypertension Transient hypotension Rapid atrial fibrillation P:  MAP goal > 65 mmHg PRN hydralazine to maintain SBP < 160 mmHg PRN metoprolol to maintian HR < 115/min  RENAL A:   CKD AKI - not a candidate for HD of any duration P:   Monitor BMET intermittently Monitor I/Os Correct electrolytes as indicated   GASTROINTESTINAL A:   Chronic dysphagia  P:   SUP: IV famotidine Consider TF protocol 11/27 Will need SLP evaluation Probably not a candidate for G-tube (per his  previous request)  HEMATOLOGIC A:   Acute blood loss anemia -unclear source of blood loss P:  DVT px: SCDs Monitor CBC intermittently Transfuse per usual guidelines   INFECTIOUS A:   Suspected aspiration pneumonia P:   Monitor temp, WBC count Micro and abx as above   ENDOCRINE A:   DM 2 -adequately controlled P:   Moderate scale SSI  NEUROLOGIC A:   History of multiple CVA Right hemiplegia Total dependent care ICU/ventilator associated discomfort P:   RASS goal: -1, -2 PAD protocol -propofol, intermittent fentanyl   FAMILY: I spoke on multiple occasions with the patient's brother who is also HC POA.  We discussed goals of care.  We agreed that intubation will be for short-term  only with a daily reassessment of response to our efforts.  We agreed that he will not be reintubated after extubation.  The brother wishes to get his other siblings in town as well as the patient's son to have further goals of care discussions.  I have requested palliative care consultation for 11/27   CCM time: 60 mins  The above time includes time spent in consultation with patient and/or family members and reviewing care plan on multidisciplinary rounds  Billy Fischer, MD PCCM service Mobile [phone] Pager [phone]    Apr 01, 2017, 4:31 PM

## 2017-03-09 ENCOUNTER — Inpatient Hospital Stay: Payer: Medicare Other

## 2017-03-09 DIAGNOSIS — I471 Supraventricular tachycardia: Secondary | ICD-10-CM

## 2017-03-09 DIAGNOSIS — R579 Shock, unspecified: Secondary | ICD-10-CM

## 2017-03-09 DIAGNOSIS — N189 Chronic kidney disease, unspecified: Secondary | ICD-10-CM

## 2017-03-09 DIAGNOSIS — N179 Acute kidney failure, unspecified: Secondary | ICD-10-CM

## 2017-03-09 DIAGNOSIS — I5023 Acute on chronic systolic (congestive) heart failure: Secondary | ICD-10-CM

## 2017-03-09 DIAGNOSIS — D649 Anemia, unspecified: Secondary | ICD-10-CM

## 2017-03-09 DIAGNOSIS — A419 Sepsis, unspecified organism: Principal | ICD-10-CM

## 2017-03-09 DIAGNOSIS — J189 Pneumonia, unspecified organism: Secondary | ICD-10-CM

## 2017-03-09 DIAGNOSIS — R6521 Severe sepsis with septic shock: Secondary | ICD-10-CM

## 2017-03-09 LAB — BPAM RBC
BLOOD PRODUCT EXPIRATION DATE: 201812122359
ISSUE DATE / TIME: 201811261244
UNIT TYPE AND RH: 5100

## 2017-03-09 LAB — BASIC METABOLIC PANEL
Anion gap: 11 (ref 5–15)
BUN: 69 mg/dL — AB (ref 6–20)
CALCIUM: 7.7 mg/dL — AB (ref 8.9–10.3)
CHLORIDE: 113 mmol/L — AB (ref 101–111)
CO2: 18 mmol/L — ABNORMAL LOW (ref 22–32)
CREATININE: 2.89 mg/dL — AB (ref 0.61–1.24)
GFR, EST AFRICAN AMERICAN: 24 mL/min — AB (ref >60)
GFR, EST NON AFRICAN AMERICAN: 21 mL/min — AB (ref >60)
Glucose, Bld: 206 mg/dL — ABNORMAL HIGH (ref 65–99)
Potassium: 4.6 mmol/L (ref 3.5–5.1)
SODIUM: 142 mmol/L (ref 135–145)

## 2017-03-09 LAB — TYPE AND SCREEN
ABO/RH(D): O POS
ANTIBODY SCREEN: NEGATIVE
UNIT DIVISION: 0

## 2017-03-09 LAB — CBC
HCT: 29.2 % — ABNORMAL LOW (ref 40.0–52.0)
Hemoglobin: 8.7 g/dL — ABNORMAL LOW (ref 13.0–18.0)
MCH: 24.5 pg — ABNORMAL LOW (ref 26.0–34.0)
MCHC: 29.9 g/dL — ABNORMAL LOW (ref 32.0–36.0)
MCV: 81.9 fL (ref 80.0–100.0)
PLATELETS: 360 10*3/uL (ref 150–440)
RBC: 3.57 MIL/uL — ABNORMAL LOW (ref 4.40–5.90)
RDW: 18.1 % — AB (ref 11.5–14.5)
WBC: 38 10*3/uL — AB (ref 3.8–10.6)

## 2017-03-09 LAB — GLUCOSE, CAPILLARY
GLUCOSE-CAPILLARY: 124 mg/dL — AB (ref 65–99)
GLUCOSE-CAPILLARY: 118 mg/dL — AB (ref 65–99)
GLUCOSE-CAPILLARY: 151 mg/dL — AB (ref 65–99)
GLUCOSE-CAPILLARY: 125 mg/dL — AB (ref 65–99)
Glucose-Capillary: 211 mg/dL — ABNORMAL HIGH (ref 65–99)
Glucose-Capillary: 190 mg/dL — ABNORMAL HIGH (ref 65–99)
Glucose-Capillary: 149 mg/dL — ABNORMAL HIGH (ref 65–99)

## 2017-03-09 LAB — TROPONIN I: Troponin I: 0.05 ng/mL (ref <0.03)

## 2017-03-09 MED ORDER — MIDAZOLAM HCL 2 MG/2ML IJ SOLN: 1.0000 mg | INTRAMUSCULAR | Status: DC | PRN

## 2017-03-09 MED ORDER — FENTANYL CITRATE (PF) 100 MCG/2ML IJ SOLN
50.0000 ug | Freq: Once | INTRAMUSCULAR | Status: AC
  Administered 2017-03-09: 50 ug via INTRAVENOUS
  Filled 2017-03-09: qty 2

## 2017-03-09 MED ORDER — SODIUM BICARBONATE 8.4 % IV SOLN
INTRAVENOUS | Status: DC
  Administered 2017-03-09 – 2017-03-10 (×2): via INTRAVENOUS
  Filled 2017-03-09 (×2): qty 100

## 2017-03-09 MED ORDER — FENTANYL BOLUS VIA INFUSION
25.0000 ug | INTRAVENOUS | Status: DC | PRN
  Administered 2017-03-09: 25 ug via INTRAVENOUS
  Filled 2017-03-09: qty 25

## 2017-03-09 MED ORDER — ORAL CARE MOUTH RINSE
15.0000 mL | Freq: Four times a day (QID) | OROMUCOSAL | Status: DC
  Administered 2017-03-09 (×5): 15 mL via OROMUCOSAL

## 2017-03-09 MED ORDER — VITAL HIGH PROTEIN PO LIQD: 1000.0000 mL | ORAL | Status: DC

## 2017-03-09 MED ORDER — MIDAZOLAM HCL 2 MG/2ML IJ SOLN
1.0000 mg | INTRAMUSCULAR | Status: DC | PRN
  Administered 2017-03-09: 1 mg via INTRAVENOUS
  Filled 2017-03-09: qty 2

## 2017-03-09 MED ORDER — CHLORHEXIDINE GLUCONATE 0.12% ORAL RINSE (MEDLINE KIT)
15.0000 mL | Freq: Two times a day (BID) | OROMUCOSAL | Status: DC
  Administered 2017-03-09 (×3): 15 mL via OROMUCOSAL

## 2017-03-09 MED ORDER — VITAL AF 1.2 CAL PO LIQD
1000.0000 mL | ORAL | Status: DC
  Administered 2017-03-09: 1000 mL

## 2017-03-09 MED ORDER — PRO-STAT SUGAR FREE PO LIQD: 30.0000 mL | Freq: Two times a day (BID) | ORAL | Status: DC

## 2017-03-09 MED ORDER — FENTANYL 2500MCG IN NS 250ML (10MCG/ML) PREMIX INFUSION
25.0000 ug/h | INTRAVENOUS | Status: DC
  Administered 2017-03-09: 50 ug/h via INTRAVENOUS
  Administered 2017-03-09 – 2017-03-10 (×2): 250 ug/h via INTRAVENOUS
  Filled 2017-03-09 (×3): qty 250

## 2017-03-09 NOTE — Consult Note (Signed)
Cardiology Consultation:   Patient ID: Malik Bruce; [id number]; 01/24/1951   Admit date: 03/06/2017 Date of Consult: 03/09/2017  Primary Care Provider: Patient, No Pcp Per Primary Cardiologist: Rockey Situ (saw x 1 as consult 02/03/2017, never as an outpatient)   Patient Profile:   Malik Bruce is a 66 y.o. 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 Manuella Ghazi, MD.  History of Present Illness:   [name] was admitted to [facility] in 07/2016 for CVA. Echo at that time showed normal EF with normal diastolic function parameters and normal PASP. He was started on Plavix and continued on statin per neurology.  He was admitted in late October, 2018 with acute respiratory distress with hypoxia in the setting of aspiration PNA, anemia of uncertain etiology, and mild CHF. Echo performed on 02/03/2017 showed EF 45-50%, unable to exclude RWMA, Gr2DD, mild MR, LA normal in size, RV systolic function normal, PASP normal. He was treated with IV ABX per IM and gently diuresed.    He returned to [facility] on 11/26 with respiratory distress with hypoxia and oxygen saturations of 65% on 3 L via nasal cannula. He was initially placed on BiPAP though did ultimately require intubation for airway protection. CXR showed pulmonary vascular congestion with diffuse pulmonary edema. Troponin mildly elevated at 0.07. Renal function has worsened with IV diuresis and hypotension. WBC 17.3-->38.8, HGB 6.8-->8.7 s/p transfusion. Lactic acid 5.2-->1.7. Blood cultures negative x 2 to date. BNP 696. EKG showed WAP as below.    Past Medical History:  Diagnosis Date  . CVA (cerebral vascular accident) ([place])   . DM type 2 (diabetes mellitus, type 2) ([place])   . HTN (hypertension)      History reviewed. No pertinent surgical history.   Home Meds: Prior to Admission medications   Medication Sig Start Date End Date Taking? Authorizing Provider  acetaminophen (TYLENOL) 500 MG tablet Take 500-1,000 mg by mouth 2 (two) times daily as needed. TAKE 2 TABLETS BY MOUTH TWICE DAILY AND 1 TABLET BY MOUTH EVERY 4 HOURS AS NEEDED   Yes [provider]  atorvastatin (LIPITOR) 40 MG tablet Take 40 mg by mouth daily.   Yes [provider]  b complex vitamins tablet Take 1 tablet by mouth daily.   Yes [provider]  baclofen (LIORESAL) 10 MG tablet Take 5 mg by mouth 3 (three) times daily.    Yes [provider]  clindamycin (CLEOCIN) 300 MG capsule Take 300 mg by mouth 2 (two) times daily. 03/04/17 03/18/2017 Yes [provider]  clopidogrel (PLAVIX) 75 MG tablet Take 1 tablet (75 mg total) by mouth daily. 08/11/16  Yes Velvet Bathe, MD  ferrous sulfate 325 (65 FE) MG EC tablet Take 325 mg by mouth 3 (three) times daily.   Yes [provider]  fluticasone (FLONASE) 50 MCG/ACT nasal spray Place 1 spray into both nostrils 2 (two) times daily.    Yes [provider]  liver oil-zinc oxide (DESITIN) 40 % ointment Apply 1 application topically as needed. APPLY 1 APPLICATION TOPICALLY BID AND PRN   Yes [provider]  loratadine (CLARITIN) 10 MG tablet Take 10 mg by mouth daily.   Yes [provider]  Melatonin 3 MG TABS Take  3 mg by mouth at bedtime.   Yes [provider]  metFORMIN (GLUCOPHAGE) 500 MG tablet Take 1,000 mg by mouth 2 (two) times daily with a meal.   Yes [provider]  metoprolol tartrate (LOPRESSOR) 25 MG tablet Take 25 mg by mouth daily.   Yes [provider]  pregabalin (LYRICA) 50 MG capsule Take 50 mg by mouth 3 (three) times daily.   Yes [provider]  alum & mag hydroxide-simeth (MINTOX) 200-200-20 MG/5ML suspension Take 30 mLs by mouth as needed for  indigestion or heartburn (Do not exceed 4 doses in 24 hours).    [provider]  guaifenesin (ROBAFEN) 100 MG/5ML syrup Take 200 mg by mouth every 6 (six) hours as needed for cough (Do not exceed 4 doses in 24 hours).    [provider]    Inpatient Medications: Scheduled Meds: . baclofen  5 mg Per Tube TID  . chlorhexidine gluconate (MEDLINE KIT)  15 mL Mouth Rinse BID  . clopidogrel  75 mg Per Tube Daily  . ferrous sulfate  300 mg Per Tube BID  . insulin aspart  0-15 Units Subcutaneous Q4H  . ipratropium-albuterol  3 mL Nebulization Q6H  . mouth rinse  15 mL Mouth Rinse QID  . Melatonin  5 mg Per Tube QHS  . pregabalin  50 mg Per Tube TID  . sodium chloride flush  3 mL Intravenous Q12H   Continuous Infusions: . sodium chloride    . ceFEPime (MAXIPIME) IV Stopped (03/03/2017 1550)  . famotidine (PEPCID) IV    . norepinephrine (LEVOPHED) Adult infusion 29 mcg/min (03/09/17 0722)  . propofol (DIPRIVAN) infusion 50 mcg/kg/min (03/09/17 0615)   PRN Meds: sodium chloride, bisacodyl, docusate, fentaNYL (SUBLIMAZE) injection, fentaNYL (SUBLIMAZE) injection, hydrALAZINE, metoprolol tartrate, [DISCONTINUED] ondansetron **OR** ondansetron (ZOFRAN) IV, senna-docusate, sodium chloride flush, sodium chloride flush  Allergies:  No Known Allergies  Social History:   Social History   Socioeconomic History  . Marital status: Unknown    Spouse name: Not on file  . Number of children: Not on file  . Years of education: Not on file  . Highest education level: Not on file  Social Needs  . Financial resource strain: Not on file  . Food insecurity - worry: Not on file  . Food insecurity - inability: Not on file  . Transportation needs - medical: Not on file  . Transportation needs - non-medical: Not on file  Occupational History  . Not on file  Tobacco Use  . Smoking status: Former Research scientist (life sciences)  . Smokeless tobacco: Former Network engineer and Sexual Activity  . Alcohol use: No    . Drug use: No  . Sexual activity: Not Currently  Other Topics Concern  . Not on file  Social History Narrative  . Not on file     Family History:  Family History  Problem Relation Age of Onset  . Dementia Mother   . Dementia Father   . CAD Brother     ROS:  Review of Systems  Unable to perform ROS: Intubated      Physical Exam/Data:   Vitals:   03/09/17 0630 03/09/17 0645 03/09/17 0700 03/09/17 0715  BP: (!) 94/51 (!) 100/48 (!) 92/52 (!) 94/45  Pulse: (!) 102 (!) 104 (!) 107 (!) 106  Resp: (!) 0 (!) 0 (!) 0 (!) 0  Temp:    (!) 97.5 F (36.4 C)  TempSrc:    Axillary  SpO2: 100% 100% 100% 100%  Weight:      Height:        Intake/Output Summary (Last 24 hours) at 03/09/2017 0855 Last data filed at 03/09/2017 0615 Gross per 24 hour  Intake 1264 ml  Output 150 ml  Net 1114 ml   Filed Weights   02/11/2017 0033 03/11/2017 0425 03/07/2017 1506  Weight: 155 lb (70.3 kg) 70 lb 8.8 oz (32 kg) 167 lb 15.9 oz (76.2 kg)   Body mass index is 27.11 kg/m.   Physical Exam: General: Ill appearing, in no acute distress. Head: Normocephalic, atraumatic, sclera non-icteric, no xanthomas, nares without discharge.  Neck: Negative for carotid bruits. JVD mildly elevated. Lungs: Diminished bilaterally. Intubated.  Heart: RRR with S1 S2. I/VI systolic murmur at the apex, no rubs, or gallops appreciated. Abdomen: Soft, non-tender, non-distended with normoactive bowel sounds. No hepatomegaly. No rebound/guarding. No obvious abdominal masses. Msk:  Strength and tone appear normal for age. Extremities: No clubbing or cyanosis. Trace bilateral LE edema. Distal pedal pulses are 1+ and equal bilaterally. Neuro: Intubated and sedated. Psych:  Intubated and sedated.   EKG:  The EKG was personally reviewed and demonstrates: WAP, 99 bpm, prior anterolateral infarct, QTc 507 msec, nonspecific st/t changes Telemetry:  Telemetry was personally reviewed and demonstrates: MAT vs Afib with RVR, low  100s bpm  Weights: Filed Weights   03/06/2017 0033 03/05/2017 0425 02/23/2017 1506  Weight: 155 lb (70.3 kg) 70 lb 8.8 oz (32 kg) 167 lb 15.9 oz (76.2 kg)    Relevant CV Studies: TTE 02/04/2017: Study Conclusions  - Left ventricle: The cavity size was normal. Systolic function was   mildly reduced. The estimated ejection fraction was in the range   of 45% to 50%. Regional wall motion abnormalities cannot be   excluded. Features are consistent with a pseudonormal left   ventricular filling pattern, with concomitant abnormal relaxation   and increased filling pressure (grade 2 diastolic dysfunction). - Mitral valve: There was mild regurgitation. - Left atrium: The atrium was normal in size. - Right ventricle: Systolic function was normal. - Pulmonary arteries: Systolic pressure was within the normal   range.  Laboratory Data:  Chemistry Recent Labs  Lab 02/24/2017 0508 02/25/2017 1833 03/09/17 0424  NA 142 143 142  K 4.0 4.5 4.6  CL 113* 112* 113*  CO2 20* 21* 18*  GLUCOSE 159* 117* 206*  BUN 57* 62* 69*  CREATININE 1.88* 2.20* 2.89*  CALCIUM 7.9* 8.2* 7.7*  GFRNONAA 35* 29* 21*  GFRAA 41* 34* 24*  ANIONGAP 9 10 11     Recent Labs  Lab 02/28/2017 0018  PROT 7.9  ALBUMIN 2.8*  AST 212*  ALT 143*  ALKPHOS 123  BILITOT 1.1   Hematology Recent Labs  Lab 03/01/2017 0508 02/17/2017 1833 03/09/17 0424  WBC 17.3* 38.8* 38.0*  RBC 2.83* 3.55* 3.57*  HGB 6.8* 8.7* 8.7*  HCT 21.8* 28.7* 29.2*  MCV 77.0* 80.9 81.9  MCH 24.0* 24.5* 24.5*  MCHC 31.2* 30.3* 29.9*  RDW 18.0* 17.8* 18.1*  PLT 274 391 360   Cardiac Enzymes Recent Labs  Lab 02/16/2017 0508 02/15/2017 1003 03/07/2017 1712  TROPONINI 0.06* 0.05* 0.07*   No results for input(s): TROPIPOC in the last 168 hours.  BNP Recent Labs  Lab 03/07/2017 0018  BNP 696.0*    DDimer No results for input(s): DDIMER in the last 168 hours.  Radiology/Studies:  Dg Chest 1 View  Result Date: 02/18/2017 IMPRESSION: Congestive  changes with cardiac enlargement, pulmonary vascular congestion, and bilateral pulmonary edema.  Electronically Signed   By: Lucienne Capers M.D.   On: 02/25/2017 06:55   Dg Abd 1 View  Result Date: 02/17/2017 IMPRESSION: NG tube tip in the distal stomach with mild gaseous distention of the stomach. Electronically Signed   By: Rolm Baptise M.D.   On: 03/04/2017 15:27   Dg Chest Port 1 View  Result Date: 03/09/2017 IMPRESSION: 1. Support lines and tubes in position as above 2. Interval decrease in bilateral ground-glass pulmonary opacity which may reflect decreasing edema or infiltrate. Electronically Signed   By: Donavan Foil M.D.   On: 03/09/2017 03:53   Dg Chest Port 1 View  Result Date: 03/09/2017 IMPRESSION: Endotracheal and nasogastric tubes are in grossly good position. Right subclavian catheter is noted in good position. Stable bilateral lung opacities are noted concerning for edema or atypical inflammation. Electronically Signed   By: Marijo Conception, M.D.   On: 02/13/2017 15:22   Dg Chest Portable 1 View  Result Date: 03/09/2017 IMPRESSION: Persistent congestive failure with cardiac enlargement, pulmonary vascular congestion, and diffuse edema. Electronically Signed   By: Lucienne Capers M.D.   On: 02/26/2017 00:41    Assessment and Plan:   1. Acute on chronic hypoxic respiratory failure: -Intubated -Likely multifactorial including chronic aspiration with aspiration pneumonitis, acute on chronic systolic CHF, pulmonary edema, and anemia -Supportive care per IM/PCCM  2. Acute on chronic systolic CHF: -He does appear mildly volume up -BP and renal function and hyupotension requiring Levophed preclude diuresis at this time -Limit fluids as able -Resume diuresis as able -Not on evidence based heart failure medications given hypotension and CKD, resume as able -Daily weights, strict Is and Os  3. Narrow complex arrhythmia: -Review of his 12-lead EKG shows WAP while on  telemetry he appears to be in MAT vs sinus tachycardia with PACs vs possible Afib with RVR -Nonetheless, his rate is well controlled given his acute illness -He is not a candidate for long term, full-dose anticoagulation given his anemia -Not on any medications for rate control at this time given his hypotension -Not a good candidate for digoxin given his CKD -Continue to monitor   4. Acute on CKD stage III-IV: -Likely ATN from hypotension and infection -Not a candidate for HD at this time per PCCM -Per primary  5. Acute anemia: -Uncertain etiology  -Per IM -Likely playing a significant role in his respiratory failure  -Maintain HGB > 8.5  6. Shock: -Likely multifactorial including septic, hypovolemic, and cardiogenic  7. Multiorgan failure: -Supportive care -Per IM  8. History of stroke: -On Plavix as an outpatient -Consider holding given anemia, defer to primary service    For questions or updates, please contact [place] Please consult [email or web address] for contact info under Cardiology/STEMI.   Signed, Christell Faith, PA-C [place] Pager: [phone] 03/09/2017, 8:55 AM

## 2017-03-09 NOTE — Consult Note (Signed)
PULMONARY / CRITICAL CARE MEDICINE   Name: Malik Bruce MRN: [id number] DOB: Dec 01, 1950    ADMISSION DATE:  2017/03/31  PT PROFILE:   [age] M SNF resident with hx of multiple CVAs, chronic dysphagia, CHF, DM2, Htn,  admitted to ICU/SDU 11/26 with respiratory distress and hypoxemia.  Initial CXR consistent with pulmonary edema.  Also concern for aspiration pneumonitis.  Patient required noninvasive ventilation on admission and therefore was admitted to ICU/SDU with request for PCCM consultation  MAJOR EVENTS/TEST RESULTS: 11/26 admission as above 11/26 progressive respiratory distress and hypoxemia despite NIPPV.  Intubated -difficult airway requiring glide scope.  New onset AF with RVR.  Labile BPs 11/27 Requiring vasopressors (NE @ 30 mcg/min). Minimally responsive on propofol. Propofol changed to fentanyl infusion. Oligo-anuric. Family conference. Poor prognosis conveyed. DNR established. Not a candidate for HD or CRRT due to poor baseline health status and limited goals of care.   INDWELLING DEVICES:: ETT 11/26 >>  [place] CVL 11/26 >>   MICRO DATA: MRSA PCR 11/26 >> NEG Resp 11/26 >>  Blood 11/26 >>   ANTIMICROBIALS:  Vanc 11/26 >> 11/26 Cefepime 11/26 >>      SUBJECTIVE:  RASS -4 on propofol infusion. Synchronous with vent. No withdrawal  VITAL SIGNS: BP (!) 95/56   Pulse (!) 108   Temp 98 F (36.7 C) (Axillary)   Resp (!) 39   Ht 5\' 6"  (1.676 m)   Wt 76.2 kg (167 lb 15.9 oz) Comment: taken on bed scale at 0511  SpO2 99%   BMI 27.11 kg/m   HEMODYNAMICS:    VENTILATOR SETTINGS: Vent Mode: PRVC FiO2 (%):  [60 %-100 %] 60 % Set Rate:  [30 bmp-35 bmp] 35 bmp Vt Set:  [350 mL] 350 mL PEEP:  [8 cmH20-10 cmH20] 10 cmH20 Plateau Pressure:  [32 cmH20-36 cmH20] 36 cmH20  INTAKE / OUTPUT: I/O last 3 completed shifts: In: 3564 [I.V.:309; Blood:330; Other:375; IV Piggyback:2550] Out: 150 [Urine:30; Emesis/NG output:120]  PHYSICAL EXAMINATION: General: RASS -4,  intubated, sedated Neuro: Right hemiplegia, no withdrawal, PERRL HEENT: NCAT, sclerae white Cardiovascular: IRIR, rate controlled, no M noted Lungs: few crackles and scattered rhonchi  Abdomen: Soft, NT, diminished BS Ext: Warm, no edema  LABS:  BMET Recent Labs  Lab 06/02/2016 0508 06/02/2016 1833 03/09/17 0424  NA 142 143 142  K 4.0 4.5 4.6  CL 113* 112* 113*  CO2 20* 21* 18*  BUN 57* 62* 69*  CREATININE 1.88* 2.20* 2.89*  GLUCOSE 159* 117* 206*    Electrolytes Recent Labs  Lab 06/02/2016 0508 06/02/2016 1833 03/09/17 0424  CALCIUM 7.9* 8.2* 7.7*    CBC Recent Labs  Lab 06/02/2016 0508 06/02/2016 1833 03/09/17 0424  WBC 17.3* 38.8* 38.0*  HGB 6.8* 8.7* 8.7*  HCT 21.8* 28.7* 29.2*  PLT 274 391 360    Coag's Recent Labs  Lab 06/02/2016 0018  INR 1.58    Sepsis Markers Recent Labs  Lab 06/02/2016 0018 06/02/2016 0508  LATICACIDVEN 5.2* 1.7    ABG Recent Labs  Lab 06/02/2016 1430  PHART 7.03*  PCO2ART 80*  PO2ART 50*    Liver Enzymes Recent Labs  Lab 06/02/2016 0018  AST 212*  ALT 143*  ALKPHOS 123  BILITOT 1.1  ALBUMIN 2.8*    Cardiac Enzymes Recent Labs  Lab 06/02/2016 0508 06/02/2016 1003 06/02/2016 1712  TROPONINI 0.06* 0.05* 0.07*    Glucose Recent Labs  Lab 06/02/2016 1605 06/02/2016 1941 06/02/2016 2338 03/09/17 0348 03/09/17 0733 03/09/17 1134  GLUCAP 150*  100* 107* 124* 211* 190*    CXR: improved B infiltrates    ASSESSMENT / PLAN:  PULMONARY A: Acute hypoxemic respiratory failure Pulmonary edema Chronic aspiration Suspect aspiration pneumonitis ARDS P:   Cont full vent support - settings reviewed and/or adjusted Cont vent bundle Daily SBT if/when meets criteria  Cont empiric nebulized bronchodilators  CARDIOVASCULAR A:  Hypertension Transient hypotension Rapid atrial fibrillation Shock - cardiogenic, septic P:  MAP goal > 65 mmHg Continue norepinephrine  RENAL A:   CKD AKI, oligo-anuric Not a candidate for HD  of any duration P:   Monitor BMET intermittently Monitor I/Os Correct electrolytes as indicated  HCO3 gtt initiated 11/27  GASTROINTESTINAL A:   Chronic dysphagia  P:   SUP: IV famotidine Consider TF protocol 11/27 Will need SLP evaluation Probably not a candidate for G-tube (per his previous request)  HEMATOLOGIC A:   Acute blood loss anemia -unclear source of blood loss P:  DVT px: SCDs Monitor CBC intermittently Transfuse per usual guidelines   INFECTIOUS A:   Suspected aspiration pneumonia P:   Monitor temp, WBC count Micro and abx as above   ENDOCRINE A:   DM 2 -adequately controlled P:   Cont moderate scale SSI  NEUROLOGIC A:   History of multiple CVA Right hemiplegia Total dependent care ICU/ventilator associated discomfort P:   RASS goal: -2, -3 PAD protocol - change propofol to fentanyl infusion   FAMILY: I spoke with 2 brothers, sister and sister-in-law. I conveyed a very poor prognosis. We agree that CRRT is not an option due to limted potential for meaningful recovery. We agreed to DNR in event of cardiac arrest. Aurther Lofterry, his brother, is to contact pt's children (one is in [place]) to get them her. Barring a miraculous turnaround, we will likely proceed with terminal extubation once they arrive   CCM time: 50 mins  The above time includes time spent in consultation with patient and/or family members and reviewing care plan on multidisciplinary rounds  Billy Fischeravid , MD PCCM service Mobile [phone] Pager [phone]    03/09/2017, 2:16 PM

## 2017-03-09 NOTE — Progress Notes (Signed)
[employer] [facility] [place] [facility]   PATIENT NAME: Malik Bruce    MR#:  [id number]  DATE OF BIRTH:  [birth year]  SUBJECTIVE:  CHIEF COMPLAINT:   Chief Complaint  Patient presents with  . Respiratory Distress  Remains on vent, looks critically sick REVIEW OF SYSTEMS:  Review of Systems  Unable to perform ROS: Critical illness    DRUG ALLERGIES:  No Known Allergies VITALS:  Blood pressure (!) 100/54, pulse (!) 101, temperature 98.5 F (36.9 C), temperature source Axillary, resp. rate (!) 35, height 5\' 6"  (1.676 m), weight 77.7 kg (171 lb 4.8 oz), SpO2 95 %. PHYSICAL EXAMINATION:  Physical Exam  Constitutional: He is well-developed, well-nourished, and in no distress.  HENT:  ETT in place  Eyes: Conjunctivae and EOM are normal. Pupils are equal, round, and reactive to light.  Neck: Normal range of motion. Neck supple. No tracheal deviation present. No thyromegaly present.  Cardiovascular: Normal rate, regular rhythm and normal heart sounds.  Pulmonary/Chest: Effort normal and breath sounds normal. No respiratory distress. He has no wheezes. He exhibits no tenderness.  Abdominal: Soft. Bowel sounds are normal. He exhibits no distension. There is no tenderness.  Musculoskeletal: Normal range of motion.  Neurological: No cranial nerve deficit.  Sedated on vent  Skin: Skin is warm and dry. No rash noted.  Psychiatric:  Unable to assess as he is sedated on vent   LABORATORY PANEL:  Male CBC Recent Labs  Lab 03/09/17 0424  WBC 38.0*  HGB 8.7*  HCT 29.2*  PLT 360   ------------------------------------------------------------------------------------------------------------------ Chemistries  Recent Labs  Lab 2017-01-26 0018  03/09/17 0424  NA 141   < > 142  K 4.2   < > 4.6  CL 110   < > 113*  CO2 17*   < > 18*  GLUCOSE 173*   < > 206*  BUN 57*   < > 69*  CREATININE 2.18*   < > 2.89*  CALCIUM 8.5*   < > 7.7*  AST 212*  --   --   ALT 143*  --    --   ALKPHOS 123  --   --   BILITOT 1.1  --   --    < > = values in this interval not displayed.   RADIOLOGY:  Dg Chest Port 1 View  Result Date: 03/09/2017 CLINICAL DATA:  Respiratory failure EXAM: PORTABLE CHEST 1 VIEW COMPARISON:  September 12, 2016 FINDINGS: Endotracheal tube tip is about 3.3 cm superior to the carina. Right-sided central venous catheter tip overlies the distal SVC. Decreased ground-glass opacity bilaterally. Esophageal tube tip below the diaphragm. Stable slightly enlarged cardiomediastinal silhouette. No pneumothorax. IMPRESSION: 1. Support lines and tubes in position as above 2. Interval decrease in bilateral ground-glass pulmonary opacity which may reflect decreasing edema or infiltrate. Electronically Signed   By: Jasmine PangKim  Fujinaga M.D.   On: 03/09/2017 03:53   ASSESSMENT AND PLAN:  66 year old male patient with history of systolic heart failure, CVA, diabetes mellitus, hypertension admitted for increased shortness of breath and low oxygen saturation.  1.Acute hypoxic respiratory failure:  Remains intubated, management per pulmonary *Septic shock with multiorgan failure: Requiring pressors.  Propofol changed to IV fentanyl infusion 2.Acute on chronic systolic congestive heart failure: cardiology following and management per them. Echo shows EF of 45-50% 3.Healthcare associated pneumonia: continue cefepime 4.Diabetes mellitus type 2: SSI for now 5.Hypertension: Continue metoprolol for now 6 acute anemia: Unsure etiology, hemoglobin 8.2 (6.8) after One packed red blood cell  transfusion 7.  Atrial fibrillation: Continue Lopressor for rate control, cardiology following     Overall very poor prognosis conveyed.  The CCM having family meeting waiting for other family members to arrive        All the records are reviewed and case discussed with Care Management/Social Worker. Management plans discussed with the patient, nursing and they are in agreement.  CODE STATUS:  DNR  TOTAL TIME TAKING CARE OF THIS PATIENT: 15 minutes.   More than 50% of the time was spent in counseling/coordination of care: YES     Malik LovettVipul Juleah Bruce M.D on 03/09/2017 at 5:05 PM  Between 7am to 6pm - Pager - [phone]  After 6pm go to [email or web address] - Social research officer, governmentpassword EPAS [facility]  [facility] [place] [facility]  Office  [phone]  CC: Primary care physician; Patient, No Pcp Per  Note: This dictation was prepared with Dragon dictation along with smaller phrase technology. Any transcriptional errors that result from this process are unintentional.

## 2017-03-09 NOTE — Progress Notes (Signed)
Notified Dana NP of patient's urine output being minimal for the beginning of the shift. Bladder scan revealed 253ml of urine suspected in bladder. NP acknowledged.  Received orders to manually irrigate foley, obtained back the irrigate fluid from foley, but no urine. (see orders and flow sheet) Per orders, preformed exchange of foley catheters. Obtained minimal urine return upon insertion of new foley catheter. Malik Harmanana NP aware, orders in place to recheck kidney functions in morning.

## 2017-03-09 NOTE — Progress Notes (Addendum)
Initial Nutrition Assessment  DOCUMENTATION CODES:   Not applicable  INTERVENTION:  Recommend initiating Vital AF 1.2 at 20 mL/hr.  If patient tolerates, recommend advancing towards goal rate of Vital AF 1.2 at 65 mL/hr (1560 mL goal daily volume). Provides 1872 kcal, 117 grams of protein, 1264 mL fluid daily.  As patient is edematous, recommend minimum free water flush of 30 mL Q4hrs to maintain tube patency.  NUTRITION DIAGNOSIS:   Inadequate oral intake related to inability to eat as evidenced by NPO status.  GOAL:   Provide needs based on ASPEN/SCCM guidelines  MONITOR:   Vent status, Labs, Weight trends, TF tolerance, Skin, I & O's  REASON FOR ASSESSMENT:   Ventilator    ASSESSMENT:   66 year old male with PMHx of CVA with residual right-sided hemiparesis, chronic dysphagia,  HTN, DM type 2, systolic heart failure admitted with respiratory distress and hypoxia found to have pulmonary edema and also possibly aspiration pneumonitis. Required intubation on 11/26 after progressive respiratory distress on BiPAP.   -Plan of care is intubation for short-term only and no reintubation after extubation. -PMT has been consulted.  Able to speak with RN at [facility] who cares for patient. She reports patient typically has a good appetite. He is on dysphagia 3 diet with honey-thick liquids for chronic aspiration (though was last on dysphagia 2 when [facility] at [facility]). He does not typically require any oral nutrition supplements to maintain his weight. He can feed himself. RN believed he had been weight stable. Per chart patient was 164.5 lbs on 08/06/2016. No previous weights in chart or Care Everywhere.  Access: 6816 French OGT placed 11/26; terminates in distal stomach per abdominal x-ray 11/26; 70 cm at corner of mouth  MAP: 61-87 mmHg  Patient is currently intubated on ventilator support MV: 12.4 L/min Temp (24hrs), Avg:98.1 F (36.7 C), Min:97.5 F (36.4 C), Max:98.7 F  (37.1 C)  Propofol: N/A  Medications reviewed and include: ferrous sulfate 300 mg BID, Novolog 0-15 units Q4hrs, cefepime, famotidine, fentanyl gtt, Levophed gtt.  Labs reviewed: CBG 100-211, Chloride 113, CO2 18, BUN 69, Creatinine 2.89. Triglycerides 90 on 11/26.  I/O: 30 mL UOP yesterday + one unmeasured occurrence  Patient does not meet criteria for malnutrition at this time.  Discussed on rounds and also discussed with RN. Plan is for initiation of tube feeds today. Plan is to come off of propofol and add fentanyl.  NUTRITION - FOCUSED PHYSICAL EXAM:    Most Recent Value  Orbital Region  No depletion  Upper Arm Region  Mild depletion  Thoracic and Lumbar Region  No depletion  Buccal Region  No depletion  Temple Region  Mild depletion  Clavicle Bone Region  No depletion  Clavicle and Acromion Bone Region  Mild depletion [only on right side]  Scapular Bone Region  No depletion  Dorsal Hand  Mild depletion [only on right side]  Patellar Region  No depletion  Anterior Thigh Region  No depletion  Posterior Calf Region  No depletion  Edema (RD Assessment)  Moderate  Hair  Reviewed  Eyes  Unable to assess  Mouth  Unable to assess  Skin  Reviewed  Nails  Reviewed      Diet Order:  No diet orders on file  EDUCATION NEEDS:   No education needs have been identified at this time  Skin:  Skin Assessment: Skin Integrity Issues: Skin Integrity Issues:: Stage II, Other (Comment) Stage II: bilateral buttocks Other: MSAD to bilateral buttocks  Last BM:  2016/07/30  Height:   Ht Readings from Last 1 Encounters:  02-26-17 5\' 6"  (1.676 m)    Weight:   Wt Readings from Last 1 Encounters:  02-26-17 167 lb 15.9 oz (76.2 kg)    Ideal Body Weight:  64.5 kg  BMI:  Body mass index is 27.11 kg/m.  Estimated Nutritional Needs:   Kcal:  1871 (PSU 2003b w/ MSJ 1479, Ve 12.4, Tmax 37.6)  Protein:  90-120 grams (1.2-1.6 grams/kg)  Fluid:  1.9-2.3 L/day (25-30  mL/kg)  Helane RimaLeanne Tomie Spizzirri, MS, RD, LDN Office: [phone] Pager: [phone] After Hours/Weekend Pager: [phone]

## 2017-03-10 ENCOUNTER — Inpatient Hospital Stay: Payer: Medicare Other

## 2017-03-10 DIAGNOSIS — R0602 Shortness of breath: Secondary | ICD-10-CM

## 2017-03-10 DIAGNOSIS — R0603 Acute respiratory distress: Secondary | ICD-10-CM

## 2017-03-10 DIAGNOSIS — R0902 Hypoxemia: Secondary | ICD-10-CM

## 2017-03-10 LAB — GLUCOSE, CAPILLARY
GLUCOSE-CAPILLARY: 99 mg/dL (ref 65–99)
Glucose-Capillary: 99 mg/dL (ref 65–99)

## 2017-03-10 LAB — COMPREHENSIVE METABOLIC PANEL
ALBUMIN: 2.3 g/dL — AB (ref 3.5–5.0)
ALK PHOS: 127 U/L — AB (ref 38–126)
ALT: 1231 U/L — ABNORMAL HIGH (ref 17–63)
ANION GAP: 13 (ref 5–15)
AST: 1758 U/L — ABNORMAL HIGH (ref 15–41)
BUN: 85 mg/dL — ABNORMAL HIGH (ref 6–20)
CHLORIDE: 108 mmol/L (ref 101–111)
CO2: 19 mmol/L — AB (ref 22–32)
Calcium: 6.7 mg/dL — ABNORMAL LOW (ref 8.9–10.3)
Creatinine, Ser: 4.06 mg/dL — ABNORMAL HIGH (ref 0.61–1.24)
GFR calc Af Amer: 16 mL/min — ABNORMAL LOW (ref >60)
GFR calc non Af Amer: 14 mL/min — ABNORMAL LOW (ref >60)
GLUCOSE: 106 mg/dL — AB (ref 65–99)
POTASSIUM: 4.3 mmol/L (ref 3.5–5.1)
SODIUM: 140 mmol/L (ref 135–145)
Total Bilirubin: 2.1 mg/dL — ABNORMAL HIGH (ref 0.3–1.2)
Total Protein: 7.1 g/dL (ref 6.5–8.1)

## 2017-03-10 LAB — BLOOD GAS, ARTERIAL
ACID-BASE DEFICIT: 10.1 mmol/L — AB (ref 0.0–2.0)
Bicarbonate: 19.1 mmol/L — ABNORMAL LOW (ref 20.0–28.0)
FIO2: 0.55
LHR: 35 {breaths}/min
MECHVT: 350 mL
Mechanical Rate: 35
O2 SAT: 80.5 %
PEEP/CPAP: 10 cmH2O
PH ART: 7.11 — AB (ref 7.350–7.450)
Patient temperature: 37
pCO2 arterial: 60 mmHg — ABNORMAL HIGH (ref 32.0–48.0)
pO2, Arterial: 61 mmHg — ABNORMAL LOW (ref 83.0–108.0)

## 2017-03-10 LAB — CBC
HCT: 26.4 % — ABNORMAL LOW (ref 40.0–52.0)
HEMOGLOBIN: 7.9 g/dL — AB (ref 13.0–18.0)
MCH: 24.2 pg — ABNORMAL LOW (ref 26.0–34.0)
MCHC: 30 g/dL — AB (ref 32.0–36.0)
MCV: 80.6 fL (ref 80.0–100.0)
PLATELETS: 253 10*3/uL (ref 150–440)
RBC: 3.28 MIL/uL — ABNORMAL LOW (ref 4.40–5.90)
RDW: 17.5 % — AB (ref 11.5–14.5)
WBC: 38.6 10*3/uL — ABNORMAL HIGH (ref 3.8–10.6)

## 2017-03-10 MED ORDER — FENTANYL 2500MCG IN NS 250ML (10MCG/ML) PREMIX INFUSION: 100.0000 ug/h | INTRAVENOUS | Status: DC

## 2017-03-10 MED ORDER — FENTANYL BOLUS VIA INFUSION
50.0000 ug | INTRAVENOUS | Status: DC | PRN
  Filled 2017-03-10: qty 200

## 2017-03-10 MED ORDER — CHLORHEXIDINE GLUCONATE 0.12% ORAL RINSE (MEDLINE KIT)
15.0000 mL | Freq: Two times a day (BID) | OROMUCOSAL | Status: DC
  Administered 2017-03-10: 15 mL via OROMUCOSAL

## 2017-03-10 MED ORDER — ORAL CARE MOUTH RINSE
15.0000 mL | OROMUCOSAL | Status: DC
  Administered 2017-03-10 (×2): 15 mL via OROMUCOSAL

## 2017-03-10 MED ORDER — FENTANYL BOLUS VIA INFUSION: 50.0000 ug | INTRAVENOUS | Status: DC | PRN

## 2017-03-10 MED ORDER — VASOPRESSIN 20 UNIT/ML IV SOLN
0.0300 [IU]/min | INTRAVENOUS | Status: DC
  Administered 2017-03-10: 0.03 [IU]/min via INTRAVENOUS
  Filled 2017-03-10: qty 2

## 2017-03-10 MED ORDER — FENTANYL 2500MCG IN NS 250ML (10MCG/ML) PREMIX INFUSION
100.0000 ug/h | INTRAVENOUS | Status: DC
  Administered 2017-03-10: 100 ug/h via INTRAVENOUS

## 2017-03-10 MED ORDER — TRAZODONE HCL 50 MG PO TABS: 50.0000 mg | ORAL_TABLET | Freq: Every evening | ORAL | Status: DC | PRN

## 2017-03-11 ENCOUNTER — Telehealth: Payer: Self-pay | Admitting: Pulmonary Disease

## 2017-03-11 LAB — CULTURE, RESPIRATORY

## 2017-03-11 LAB — CULTURE, RESPIRATORY W GRAM STAIN

## 2017-03-11 LAB — GLUCOSE, CAPILLARY: Glucose-Capillary: <10 mg/dL — CL (ref 65–99)

## 2017-03-11 NOTE — Telephone Encounter (Signed)
[employer] dropped off Death Certificate to be signed Placed in Nurse Box

## 2017-03-11 NOTE — Telephone Encounter (Signed)
Placed in DS' folder to fill out

## 2017-03-12 NOTE — Telephone Encounter (Signed)
[facility] [name] informed death cert is ready for pick up.

## 2017-03-13 LAB — CULTURE, BLOOD (ROUTINE X 2)
CULTURE: NO GROWTH
Culture: NO GROWTH
SPECIAL REQUESTS: ADEQUATE

## 2017-03-13 NOTE — Progress Notes (Signed)
   08-28-16 1800  Clinical Encounter Type  Visited With Patient not available;Family  Visit Type Death  Referral From Nurse  Spiritual Encounters  Spiritual Needs Emotional;Grief support;Prayer  CH paged to come and comfort family following patient death; CH offered prayer, emotional and grief support and is available as needed to continue support.

## 2017-03-13 NOTE — Progress Notes (Signed)
Cannot obtain abg.

## 2017-03-13 NOTE — Progress Notes (Signed)
Spoke with E-LINK RN while completing their video rounds concerning Levophed parameters. Acknowledged and Levophed order modified by Glade NurseE-LINK MD.

## 2017-03-13 NOTE — Progress Notes (Signed)
[employer] - [place] at [facility]   PATIENT NAME: Malik MoundJames Cartmell    MR#:  [id number]  DATE OF BIRTH:  07/19/1950  SUBJECTIVE:  CHIEF COMPLAINT:   Chief Complaint  Patient presents with  . Respiratory Distress  Remains on vent, looks critically sick, hypotensive REVIEW OF SYSTEMS:  Review of Systems  Unable to perform ROS: Critical illness    DRUG ALLERGIES:  No Known Allergies VITALS:  Blood pressure (!) 89/42, pulse 68, temperature 97.6 F (36.4 C), temperature source Axillary, resp. rate (!) 33, height 5\' 6"  (1.676 m), weight 77.9 kg (171 lb 11.8 oz), SpO2 94 %. PHYSICAL EXAMINATION:  Physical Exam  Constitutional: He is well-developed, well-nourished, and in no distress.  HENT:  ETT in place  Eyes: Conjunctivae and EOM are normal. Pupils are equal, round, and reactive to light.  Neck: Normal range of motion. Neck supple. No tracheal deviation present. No thyromegaly present.  Cardiovascular: Normal rate, regular rhythm and normal heart sounds.  Pulmonary/Chest: Effort normal and breath sounds normal. No respiratory distress. He has no wheezes. He exhibits no tenderness.  Abdominal: Soft. Bowel sounds are normal. He exhibits no distension. There is no tenderness.  Musculoskeletal: Normal range of motion.  Neurological: No cranial nerve deficit.  Sedated on vent  Skin: Skin is warm and dry. No rash noted.  Psychiatric:  Unable to assess as he is sedated on vent   LABORATORY PANEL:  Male CBC Recent Labs  Lab 10/02/16 0406  WBC 38.6*  HGB 7.9*  HCT 26.4*  PLT 253   ------------------------------------------------------------------------------------------------------------------ Chemistries  Recent Labs  Lab 10/02/16 0406  NA 140  K 4.3  CL 108  CO2 19*  GLUCOSE 106*  BUN 85*  CREATININE 4.06*  CALCIUM 6.7*  AST 1,758*  ALT 1,231*  ALKPHOS 127*  BILITOT 2.1*   RADIOLOGY:  Dg Chest Port 1 View  Result Date: December 22, 2016 CLINICAL  DATA:  66 year old male with Respiratory distress. Decompensated heart failure, pneumonia. EXAM: PORTABLE CHEST 1 VIEW COMPARISON:  03/09/2017 and earlier. FINDINGS: Portable AP semi upright view at at 0525 hours. Endotracheal tube tip in good position just below the clavicles. Right subclavian approach central line has been pulled back, tip now up the level of the carina, SVC. Enteric tube courses to the abdomen, tip not included. Mildly lower lung volumes with increasing bibasilar opacity greater on the right. Continued widespread bilateral pulmonary interstitial opacity, mildly increased in confluent since yesterday. No pneumothorax. No large pleural effusion. Stable cardiac size and mediastinal contours. IMPRESSION: 1. Right subclavian approach central line tip now at the carina/SVC level. 2.  Otherwise stable lines and tubes. 3. Lower lung volumes. Interval increased opacity at the right lung base could reflect worsening infection or atelectasis. 4. Widespread bilateral pulmonary interstitial opacity which more resembles viral/atypical infection than interstitial edema. Electronically Signed   By: Odessa FlemingH  Hall M.D.   On: December 22, 2016 07:27   ASSESSMENT AND PLAN:  66 year old male patient with history of systolic heart failure, CVA, diabetes mellitus, hypertension admitted for increased shortness of breath and low oxygen saturation.  1.Acute hypoxic respiratory failure:  plan for terminal extubation once all family arrives later today 2.Acute on chronic systolic congestive heart failure: Echo shows EF of 45-50% 3.Healthcare associated pneumonia:  4.Diabetes mellitus type 2:  5.Hypertension: 6 acute anemia:  7.  Atrial fibrillation:      Terminal extubation later today. Comfort care as per PCCM d/w family       All the records  are reviewed and case discussed with Care Management/Social Worker. Management plans discussed with the patient, nursing and they are in agreement.  CODE STATUS:  Prior  TOTAL TIME TAKING CARE OF THIS PATIENT: 15 minutes.   More than 50% of the time was spent in counseling/coordination of care: YES     Delfino LovettVipul Cotina Freedman M.D on 03/03/2017 at 5:27 PM  Between 7am to 6pm - Pager - [phone]  After 6pm go to [email or web address] - Social research officer, governmentpassword [email or web address] [email or web address]  [facility]  Hospitalists  Office  [phone]  CC: Primary care physician; Patient, No Pcp Per  Note: This dictation was prepared with Dragon dictation along with smaller phrase technology. Any transcriptional errors that result from this process are unintentional.

## 2017-03-13 NOTE — Progress Notes (Signed)
Spoke with Lexine BatonBincy, NP concerning Levophed parameters. Per Bincy, NP a MAP of 60 and SBP in the 80's is acceptable.

## 2017-03-13 NOTE — Progress Notes (Signed)
Progress Note  Patient Name: Malik Bruce Date of Encounter: 27-Mar-2017  Primary Cardiologist: [facility], End  Subjective   Intubated, 55% FiO2 On nor epi, vasopressin Systolic pressures in the 90s Family meeting later today  Echocardiogram reviewed from February 04, 2017 showing ejection fraction 45-50%   Inpatient Medications    Scheduled Meds: . chlorhexidine gluconate (MEDLINE KIT)  15 mL Mouth Rinse BID  . clopidogrel  75 mg Per Tube Daily  . ferrous sulfate  300 mg Per Tube BID  . insulin aspart  0-15 Units Subcutaneous Q4H  . ipratropium-albuterol  3 mL Nebulization Q6H  . mouth rinse  15 mL Mouth Rinse 10 times per day  . sodium chloride flush  3 mL Intravenous Q12H   Continuous Infusions: . sodium chloride    . ceFEPime (MAXIPIME) IV Stopped (03/09/17 1628)  . famotidine (PEPCID) IV Stopped (03/09/17 1809)  . feeding supplement (VITAL AF 1.2 CAL) 1,000 mL (03/09/17 1939)  . fentaNYL infusion INTRAVENOUS 250 mcg/hr (March 27, 2017 0654)  . norepinephrine (LEVOPHED) Adult infusion 40 mcg/min (March 27, 2017 0536)  .  sodium bicarbonate  infusion 1000 mL 50 mL/hr at 03/27/2017 0644  . vasopressin (PITRESSIN) infusion - *FOR SHOCK* 0.03 Units/min (03-27-2017 0134)   PRN Meds: sodium chloride, fentaNYL, hydrALAZINE, metoprolol tartrate, midazolam, [DISCONTINUED] ondansetron **OR** ondansetron (ZOFRAN) IV, senna-docusate, sodium chloride flush, sodium chloride flush, traZODone   Vital Signs    Vitals:   2017/03/27 0614 27-Mar-2017 0615 2017/03/27 0630 03/27/17 0809  BP:  (!) 101/54 (!) 102/46   Pulse:  84 85   Resp:  11 (!) 8   Temp:      TempSrc:      SpO2:  93% 94% 93%  Weight: 171 lb 11.8 oz (77.9 kg)     Height:        Intake/Output Summary (Last 24 hours) at 03/27/17 0946 Last data filed at 2017-03-27 0600 Gross per 24 hour  Intake 2863.32 ml  Output 24 ml  Net 2839.32 ml   Filed Weights   03/03/2017 1506 03/09/17 1629 27-Mar-2017 0614  Weight: 167 lb 15.9 oz (76.2 kg)  171 lb 4.8 oz (77.7 kg) 171 lb 11.8 oz (77.9 kg)    Telemetry    Sinus rhythm with APCs- Personally Reviewed  ECG      Physical Exam   GEN:  Intubated, sedated Neck:   unable to estimate JVP Cardiac:  Irregular no murmurs, rubs, or gallops.  Trace edema upper extremities, no significant lower extremity edema  Respiratory: Coarse breath sounds bilaterally GI: Soft, nontender, non-distended  MS:  no deformity. Neuro:  Nonfocal  Psych: Normal affect   Labs    Chemistry Recent Labs  Lab 03/07/2017 0018  03/02/2017 1833 03/09/17 0424 March 27, 2017 0406  NA 141   < > 143 142 140  K 4.2   < > 4.5 4.6 4.3  CL 110   < > 112* 113* 108  CO2 17*   < > 21* 18* 19*  GLUCOSE 173*   < > 117* 206* 106*  BUN 57*   < > 62* 69* 85*  CREATININE 2.18*   < > 2.20* 2.89* 4.06*  CALCIUM 8.5*   < > 8.2* 7.7* 6.7*  PROT 7.9  --   --   --  7.1  ALBUMIN 2.8*  --   --   --  2.3*  AST 212*  --   --   --  1,758*  ALT 143*  --   --   --  1,231*  ALKPHOS 123  --   --   --  127*  BILITOT 1.1  --   --   --  2.1*  GFRNONAA 29*   < > 29* 21* 14*  GFRAA 34*   < > 34* 24* 16*  ANIONGAP 14   < > 10 11 13    < > = values in this interval not displayed.     Hematology Recent Labs  Lab 03/04/2017 1833 03/09/17 0424 Mar 13, 2017 0406  WBC 38.8* 38.0* 38.6*  RBC 3.55* 3.57* 3.28*  HGB 8.7* 8.7* 7.9*  HCT 28.7* 29.2* 26.4*  MCV 80.9 81.9 80.6  MCH 24.5* 24.5* 24.2*  MCHC 30.3* 29.9* 30.0*  RDW 17.8* 18.1* 17.5*  PLT 391 360 253    Cardiac Enzymes Recent Labs  Lab 03/05/2017 0508 02/17/2017 1003 03/02/2017 1712  TROPONINI 0.06* 0.05* 0.07*   No results for input(s): TROPIPOC in the last 168 hours.   BNP Recent Labs  Lab 03/09/2017 0018  BNP 696.0*     DDimer No results for input(s): DDIMER in the last 168 hours.   Radiology    Dg Abd 1 View  Result Date: 03/04/2017 CLINICAL DATA:  OG tube placement EXAM: ABDOMEN - 1 VIEW COMPARISON:  None. FINDINGS: NG tube tip is in the distal stomach. Mild  gaseous distention of the stomach. Large stool burden throughout the colon. IMPRESSION: NG tube tip in the distal stomach with mild gaseous distention of the stomach. Electronically Signed   By: Rolm Baptise M.D.   On: 02/27/2017 15:27   Dg Chest Port 1 View  Result Date: 03-13-17 CLINICAL DATA:  66 year old male with Respiratory distress. Decompensated heart failure, pneumonia. EXAM: PORTABLE CHEST 1 VIEW COMPARISON:  03/09/2017 and earlier. FINDINGS: Portable AP semi upright view at at 0525 hours. Endotracheal tube tip in good position just below the clavicles. Right subclavian approach central line has been pulled back, tip now up the level of the carina, SVC. Enteric tube courses to the abdomen, tip not included. Mildly lower lung volumes with increasing bibasilar opacity greater on the right. Continued widespread bilateral pulmonary interstitial opacity, mildly increased in confluent since yesterday. No pneumothorax. No large pleural effusion. Stable cardiac size and mediastinal contours. IMPRESSION: 1. Right subclavian approach central line tip now at the carina/SVC level. 2.  Otherwise stable lines and tubes. 3. Lower lung volumes. Interval increased opacity at the right lung base could reflect worsening infection or atelectasis. 4. Widespread bilateral pulmonary interstitial opacity which more resembles viral/atypical infection than interstitial edema. Electronically Signed   By: Genevie Ann M.D.   On: 03/13/17 07:27   Dg Chest Port 1 View  Result Date: 03/09/2017 CLINICAL DATA:  Respiratory failure EXAM: PORTABLE CHEST 1 VIEW COMPARISON:  03/02/2017 FINDINGS: Endotracheal tube tip is about 3.3 cm superior to the carina. Right-sided central venous catheter tip overlies the distal SVC. Decreased ground-glass opacity bilaterally. Esophageal tube tip below the diaphragm. Stable slightly enlarged cardiomediastinal silhouette. No pneumothorax. IMPRESSION: 1. Support lines and tubes in position as above  2. Interval decrease in bilateral ground-glass pulmonary opacity which may reflect decreasing edema or infiltrate. Electronically Signed   By: Donavan Foil M.D.   On: 03/09/2017 03:53   Dg Chest Port 1 View  Result Date: 03/11/2017 CLINICAL DATA:  Central line placement. EXAM: PORTABLE CHEST 1 VIEW COMPARISON:  Radiograph of same day. FINDINGS: Stable cardiomediastinal silhouette. Endotracheal tube is projected over tracheal air shadow with distal tip 2.8 cm above the carina. Nasogastric tube  is seen entering the stomach. Interval placement of right subclavian catheter line with distal tip in expected position of cavoatrial junction. Stable diffuse interstitial densities are noted throughout both lungs concerning for edema or inflammation. Bony thorax is unremarkable. No pneumothorax or pleural effusion is noted. IMPRESSION: Endotracheal and nasogastric tubes are in grossly good position. Right subclavian catheter is noted in good position. Stable bilateral lung opacities are noted concerning for edema or atypical inflammation. Electronically Signed   By: Marijo Conception, M.D.   On: 03/09/2017 15:22    Cardiac Studies   Previous echocardiogram with ejection fraction 45-50%  Patient Profile     Mr. Eyerman is a 66 year old man with history of  chronic systolic heart failure,  multiple prior strokes,  Hypertension, hyperlipidemia,  diabetes mellitus,  chronic kidney disease,  anemia,  possible temporal arteritis,  Presenting with respiratory distress and hypoxia with oxygen saturations of 65% on 3 L by nasal cannula. Intubation, leukocytosis and severe anemia  On pressors for blood pressure support  Assessment & Plan      1. Acute on chronic hypoxic respiratory failure: multifactorial including chronic aspiration with aspiration pneumonitis,  acute on chronic systolic CHF, pulmonary edema, and anemia -Diuretic use limited in the setting of hypotension and sepsis  2. Acute on  chronic systolic CHF:  requiring Levophed and vasopressin for blood pressure support Limited diuretic use would concentrate all drips.  All CHF medications held given hypotension  3. Narrow complex arrhythmia: Rate improved Rhythm this morning consistent with sinus and APCs, unable to definitively exclude atrial fibrillation  Could order EKG to confirm  4. 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 Acute anemia: -Likely playing a significant role in his respiratory failure  Worse in the past 24 hours hematocrit 26,   6. Shock: -Likely multifactorial including septic, hypovolemic, and cardiogenic On  Pressors  7. Multiorgan failure: -Supportive care  Critically ill  8. History of stroke: -On Plavix as an outpatient   Total encounter time more than 25 minutes  Greater than 50% was spent in counseling and coordination of care with the patient    For questions or updates, please contact [place] Please consult [email or web address] for contact info under Cardiology/STEMI.      Signed, Ida Rogue, MD  Mar 24, 2017, 9:46 AM

## 2017-03-13 NOTE — Consult Note (Signed)
PULMONARY / CRITICAL CARE MEDICINE   Name: Modena JanskyJames A Rockhill MRN: [id number] DOB: [birth year]    ADMISSION DATE:  02/19/2017  PT PROFILE:   [age] M SNF resident with hx of multiple CVAs, chronic dysphagia, CHF, DM2, Htn,  admitted to ICU/SDU 11/26 with respiratory distress and hypoxemia.  Initial CXR consistent with pulmonary edema.  Also concern for aspiration pneumonitis.  Patient required noninvasive ventilation on admission and therefore was admitted to ICU/SDU with request for PCCM consultation  MAJOR EVENTS/TEST RESULTS: 11/26 admission as above 11/26 progressive respiratory distress and hypoxemia despite NIPPV.  Intubated -difficult airway requiring glide scope.  New onset AF with RVR.  Labile BPs 11/27 Requiring vasopressors (NE @ 30 mcg/min). Minimally responsive on propofol. Propofol changed to fentanyl infusion. Oligo-anuric. Family conference. Poor prognosis conveyed. DNR established. Not a candidate for HD or CRRT due to poor baseline health status and limited goals of care.  11/28 no significant change or improvement.  Remains on maximum dose norepinephrine.  Remains on ventilator.  Remains anuric.  Further family discussion.  Other family members have arrived from out of town.  All are in agreement with terminal extubation and full comfort care.  INDWELLING DEVICES:: ETT 11/26 >>  R [place] CVL 11/26 >>   MICRO DATA: MRSA PCR 11/26 >> NEG Resp 11/26 >>  Blood 11/26 >>   ANTIMICROBIALS:  Vanc 11/26 >> 11/26 Cefepime 11/26 >> 11/28     SUBJECTIVE:  RASS -5 on fentanyl. Synchronous with vent. No withdrawal.   VITAL SIGNS: BP (!) 102/46   Pulse 85   Temp 99.1 F (37.3 C) (Axillary)   Resp (!) 8   Ht 5\' 6"  (1.676 m)   Wt 77.9 kg (171 lb 11.8 oz)   SpO2 97%   BMI 27.72 kg/m   HEMODYNAMICS:    VENTILATOR SETTINGS: Vent Mode: PRVC FiO2 (%):  [50 %-55 %] 55 % Set Rate:  [35 bmp] 35 bmp Vt Set:  [350 mL] 350 mL PEEP:  [10 cmH20] 10 cmH20 Plateau Pressure:  [28 cmH20]  28 cmH20  INTAKE / OUTPUT: I/O last 3 completed shifts: In: 3176.4 [I.V.:2578; Other:25; NG/GT:373.3; IV Piggyback:200] Out: 174 [Urine:54; Emesis/NG output:120]  PHYSICAL EXAMINATION: General: RASS -5, intubated, sedated Neuro: Right hemiplegia, no withdrawal, PERRL HEENT: NCAT, sclerae white Cardiovascular: IRIR, tachycardia, no M noted Lungs: few crackles and scattered rhonchi  Abdomen: Soft, NT, diminished BS Ext: Warm, no edema  LABS:  BMET Recent Labs  Lab 03/01/2017 1833 03/09/17 0424 08-17-2016 0406  NA 143 142 140  K 4.5 4.6 4.3  CL 112* 113* 108  CO2 21* 18* 19*  BUN 62* 69* 85*  CREATININE 2.20* 2.89* 4.06*  GLUCOSE 117* 206* 106*    Electrolytes Recent Labs  Lab 02/14/2017 1833 03/09/17 0424 08-17-2016 0406  CALCIUM 8.2* 7.7* 6.7*    CBC Recent Labs  Lab 02/23/2017 1833 03/09/17 0424 08-17-2016 0406  WBC 38.8* 38.0* 38.6*  HGB 8.7* 8.7* 7.9*  HCT 28.7* 29.2* 26.4*  PLT 391 360 253    Coag's Recent Labs  Lab 02/18/2017 0018  INR 1.58    Sepsis Markers Recent Labs  Lab 03/02/2017 0018 03/07/2017 0508  LATICACIDVEN 5.2* 1.7    ABG Recent Labs  Lab 02/20/2017 1430 08-17-2016 0902  PHART 7.03* 7.11*  PCO2ART 80* 60*  PO2ART 50* 61*    Liver Enzymes Recent Labs  Lab 02/15/2017 0018 08-17-2016 0406  AST 212* 1,758*  ALT 143* 1,231*  ALKPHOS 123 127*  BILITOT 1.1 2.1*  ALBUMIN 2.8* 2.3*    Cardiac Enzymes Recent Labs  Lab 02/28/2017 0508 03/02/2017 1003 03/12/2017 1712  TROPONINI 0.06* 0.05* 0.07*    Glucose Recent Labs  Lab 03/09/17 2010 03/09/17 2324 09-08-16 0409 09-08-16 0738 09-08-16 0741 09-08-16 1201  GLUCAP 149* 125* 99 <10* 99 <10*    CXR: Bilateral R > L infiltrates    ASSESSMENT / PLAN: Acute hypoxemic respiratory failure Pulmonary edema Chronic aspiration Suspect aspiration pneumonitis ARDS Hypertension Transient hypotension Rapid atrial fibrillation Shock - cardiogenic, septic CKD AKI, oligo-anuric Not a  candidate for HD of any duration   Chronic dysphagia    Acute blood loss anemia -unclear source of blood loss  Suspected aspiration pneumonia DM 2 -adequately controlled  History of multiple CVA Right hemiplegia Total dependent care ICU/ventilator associated discomfort P:   After much family discussion, all are ready to proceed with terminal extubation and full comfort care.  "Withdrawal" protocol has been entered.    Billy Fischeravid Candace Ramus, MD PCCM service Mobile [phone] Pager [phone]    04-10-17, 2:35 PM

## 2017-03-13 NOTE — Consult Note (Signed)
EOL:  CH spoke with RN about family request to extubate this evening; CH available as needed to support family

## 2017-03-13 NOTE — Progress Notes (Signed)
Patient is placed on high fowlers position, suctioned orally and endotracheally and then extubated to room air

## 2017-03-13 NOTE — Death Summary Note (Signed)
DEATH SUMMARY   Patient Details  Name: Malik Bruce MRN: [id number] DOB: [birth year]  Admission/Discharge Information   Admit Date:  03/04/2017  Date of Death:  10-07-2016  Time of Death:  18:54  Length of Stay: 2  Referring Physician: Patient, No Pcp Per   Reason(s) for Hospitalization  Septic Shock with Multiorgan Failure  Diagnoses  Preliminary cause of death: Severe sepsis with septic shock (HCC) Secondary Diagnoses (including complications and co-morbidities):  Active Problems:   HCAP (healthcare-associated pneumonia)   Brief Hospital Course (including significant findings, care, treatment, and services provided and events leading to death)  Malik Bruce is a 66 y.o. year old male who was admitted to ICU/SDU 11/26 with respiratory distress and hypoxemia.  Initial CXR consistent with pulmonary edema.  Also concern for aspiration pneumonitis.  Patient required noninvasive ventilation on admission, however due to worsening respiratory distress and hypoxemia he required mechanical intubation on 02/14/2017.  On 03/09/17 he required high dose vasopressor, developed worsening renal failure and at that time it was determined he was not a candidate for any form of dialysis, therefore family decided to change code status to DNR.  On 2016/12/30 pt remained on maximum dose of norepinephrine with no significant change or improvement, therefore after further discussion with pts family the pt was extubated and transitioned to comfort measures only.  He expired on 10-07-2016 at 18:54 with family present at bedside  Pertinent Labs and Studies  Significant Diagnostic Studies Dg Chest 1 View  Result Date: 03/09/2017 CLINICAL DATA:  History of congestive heart failure presents today with respiratory distress. Hypoxia. Decreased oxygen saturation. EXAM: CHEST 1 VIEW COMPARISON:  03/01/2017 FINDINGS: Shallow inspiration with atelectasis in the lung bases. Cardiac enlargement. Pulmonary vascular  congestion. Hazy perihilar infiltrates consistent with edema. No definite pleural effusions. No pneumothorax. Degenerative changes in the shoulders and spine. IMPRESSION: Congestive changes with cardiac enlargement, pulmonary vascular congestion, and bilateral pulmonary edema. Electronically Signed   By: Burman NievesWilliam  Stevens M.D.   On: 03/02/2017 06:55   Dg Abd 1 View  Result Date: 03/07/2017 CLINICAL DATA:  OG tube placement EXAM: ABDOMEN - 1 VIEW COMPARISON:  None. FINDINGS: NG tube tip is in the distal stomach. Mild gaseous distention of the stomach. Large stool burden throughout the colon. IMPRESSION: NG tube tip in the distal stomach with mild gaseous distention of the stomach. Electronically Signed   By: Charlett NoseKevin  Dover M.D.   On: 02/19/2017 15:27   Dg Chest Port 1 View  Result Date: 10-07-2016 CLINICAL DATA:  66 year old male with Respiratory distress. Decompensated heart failure, pneumonia. EXAM: PORTABLE CHEST 1 VIEW COMPARISON:  03/09/2017 and earlier. FINDINGS: Portable AP semi upright view at at 0525 hours. Endotracheal tube tip in good position just below the clavicles. Right subclavian approach central line has been pulled back, tip now up the level of the carina, SVC. Enteric tube courses to the abdomen, tip not included. Mildly lower lung volumes with increasing bibasilar opacity greater on the right. Continued widespread bilateral pulmonary interstitial opacity, mildly increased in confluent since yesterday. No pneumothorax. No large pleural effusion. Stable cardiac size and mediastinal contours. IMPRESSION: 1. Right subclavian approach central line tip now at the carina/SVC level. 2.  Otherwise stable lines and tubes. 3. Lower lung volumes. Interval increased opacity at the right lung base could reflect worsening infection or atelectasis. 4. Widespread bilateral pulmonary interstitial opacity which more resembles viral/atypical infection than interstitial edema. Electronically Signed   By: Odessa FlemingH   Hall M.D.   On:  2017-04-13 07:27   Dg Chest Port 1 View  Result Date: 03/09/2017 CLINICAL DATA:  Respiratory failure EXAM: PORTABLE CHEST 1 VIEW COMPARISON:  03/05/2017 FINDINGS: Endotracheal tube tip is about 3.3 cm superior to the carina. Right-sided central venous catheter tip overlies the distal SVC. Decreased ground-glass opacity bilaterally. Esophageal tube tip below the diaphragm. Stable slightly enlarged cardiomediastinal silhouette. No pneumothorax. IMPRESSION: 1. Support lines and tubes in position as above 2. Interval decrease in bilateral ground-glass pulmonary opacity which may reflect decreasing edema or infiltrate. Electronically Signed   By: Jasmine PangKim  Fujinaga M.D.   On: 03/09/2017 03:53   Dg Chest Port 1 View  Result Date: 02/25/2017 CLINICAL DATA:  Central line placement. EXAM: PORTABLE CHEST 1 VIEW COMPARISON:  Radiograph of same day. FINDINGS: Stable cardiomediastinal silhouette. Endotracheal tube is projected over tracheal air shadow with distal tip 2.8 cm above the carina. Nasogastric tube is seen entering the stomach. Interval placement of right subclavian catheter line with distal tip in expected position of cavoatrial junction. Stable diffuse interstitial densities are noted throughout both lungs concerning for edema or inflammation. Bony thorax is unremarkable. No pneumothorax or pleural effusion is noted. IMPRESSION: Endotracheal and nasogastric tubes are in grossly good position. Right subclavian catheter is noted in good position. Stable bilateral lung opacities are noted concerning for edema or atypical inflammation. Electronically Signed   By: Lupita RaiderJames  Green Jr, M.D.   On: 02/18/2017 15:22   Dg Chest Portable 1 View  Result Date: 02/17/2017 CLINICAL DATA:  Shortness of breath and respiratory distress since this evening. Right-sided weakness. EXAM: PORTABLE CHEST 1 VIEW COMPARISON:  02/02/2017 FINDINGS: Shallow inspiration. Cardiac enlargement. Pulmonary vascular congestion.  Diffuse airspace disease throughout the lungs likely representing edema. Appearances are similar to the previous study. No focal consolidation. No blunting of costophrenic angles. No pneumothorax. Degenerative changes in the spine and shoulders. IMPRESSION: Persistent congestive failure with cardiac enlargement, pulmonary vascular congestion, and diffuse edema. Electronically Signed   By: Burman NievesWilliam  Stevens M.D.   On: 02/25/2017 00:41    Microbiology Recent Results (from the past 240 hour(s))  Culture, blood (Routine x 2)     Status: None (Preliminary result)   Collection Time: 03/07/2017 12:19 AM  Result Value Ref Range Status   Specimen Description BLOOD LEFT ANTECUBITAL  Final   Special Requests   Final    BOTTLES DRAWN AEROBIC AND ANAEROBIC Blood Culture adequate volume   Culture NO GROWTH 2 DAYS  Final   Report Status PENDING  Incomplete  Culture, blood (Routine x 2)     Status: None (Preliminary result)   Collection Time: 03/07/2017 12:19 AM  Result Value Ref Range Status   Specimen Description BLOOD BLOOD RIGHT FOREARM  Final   Special Requests   Final    BOTTLES DRAWN AEROBIC AND ANAEROBIC Blood Culture results may not be optimal due to an excessive volume of blood received in culture bottles   Culture NO GROWTH 2 DAYS  Final   Report Status PENDING  Incomplete  MRSA PCR Screening     Status: None   Collection Time: 03/12/2017  4:50 AM  Result Value Ref Range Status   MRSA by PCR NEGATIVE NEGATIVE Final    Comment:        The GeneXpert MRSA Assay (FDA approved for NASAL specimens only), is one component of a comprehensive MRSA colonization surveillance program. It is not intended to diagnose MRSA infection nor to guide or monitor treatment for MRSA infections.   Culture, respiratory (NON-Expectorated)  Status: None (Preliminary result)   Collection Time: 02/28/2017  3:02 PM  Result Value Ref Range Status   Specimen Description TRACHEAL ASPIRATE  Final   Special Requests NONE   Final   Gram Stain   Final    FEW WBC PRESENT, PREDOMINANTLY PMN FEW YEAST RARE GRAM POSITIVE COCCI IN CLUSTERS    Culture   Final    CULTURE REINCUBATED FOR BETTER GROWTH Performed at [facility], [address], [place], [place] [address]    Report Status PENDING  Incomplete    Lab Basic Metabolic Panel: Recent Labs  Lab 02/12/2017 0018 02/28/2017 0508 02/11/2017 1833 03/09/17 0424 27-Mar-2017 0406  NA 141 142 143 142 140  K 4.2 4.0 4.5 4.6 4.3  CL 110 113* 112* 113* 108  CO2 17* 20* 21* 18* 19*  GLUCOSE 173* 159* 117* 206* 106*  BUN 57* 57* 62* 69* 85*  CREATININE 2.18* 1.88* 2.20* 2.89* 4.06*  CALCIUM 8.5* 7.9* 8.2* 7.7* 6.7*   Liver Function Tests: Recent Labs  Lab 03/03/2017 0018 27-Mar-2017 0406  AST 212* 1,758*  ALT 143* 1,231*  ALKPHOS 123 127*  BILITOT 1.1 2.1*  PROT 7.9 7.1  ALBUMIN 2.8* 2.3*   No results for input(s): LIPASE, AMYLASE in the last 168 hours. No results for input(s): AMMONIA in the last 168 hours. CBC: Recent Labs  Lab 02/21/2017 0018 02/18/2017 0508 02/27/2017 1833 03/09/17 0424 03/27/2017 0406  WBC 16.9* 17.3* 38.8* 38.0* 38.6*  NEUTROABS 15.0*  --   --   --   --   HGB 7.6* 6.8* 8.7* 8.7* 7.9*  HCT 24.7* 21.8* 28.7* 29.2* 26.4*  MCV 77.7* 77.0* 80.9 81.9 80.6  PLT 312 274 391 360 253   Cardiac Enzymes: Recent Labs  Lab 03/02/2017 0508 02/12/2017 1003 03/04/2017 1712  TROPONINI 0.06* 0.05* 0.07*   Sepsis Labs: Recent Labs  Lab 02/24/2017 0018 02/11/2017 0508 02/24/2017 1833 03/09/17 0424 2017/03/27 0406  WBC 16.9* 17.3* 38.8* 38.0* 38.6*  LATICACIDVEN 5.2* 1.7  --   --   --     Procedures/Operations  ETT 11/26 R [place]ley CVL 11/26   Sonda Rumble, [place]  Pulmonary/Critical Care Pager [phone] (please enter 7 digits) PCCM Consult Pager [phone] (please enter 7 digits)

## 2017-03-13 DEATH — deceased

## 2018-08-10 IMAGING — MR MR HEAD W/O CM
10 of 12 series · 31 of 48 positions shown · non-contrast
Comparison: Head CT 08/06/2016

CLINICAL DATA: A aphasia and left-sided weakness. Left facial
droop.

EXAM:
MRI HEAD WITHOUT CONTRAST
MRA HEAD WITHOUT CONTRAST
TECHNIQUE: Multiplanar, multiecho pulse sequences of the brain and surrounding
structures were obtained without intravenous contrast. Angiographic
images of the head were obtained using MRA technique without
contrast.

[Series 4: DWI · axial · 3.0mm · 0.94mm/px · z∈[-111,+27]mm · 7 of 100 slices shown (1 of 2)]
[im 1/100]
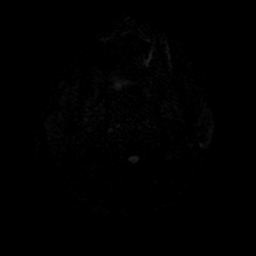
[im 17/100]
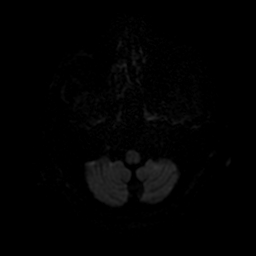
[im 34/100]
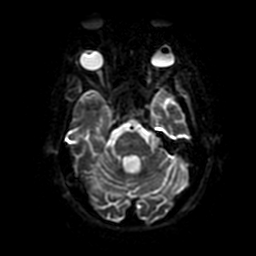
[im 50/100]
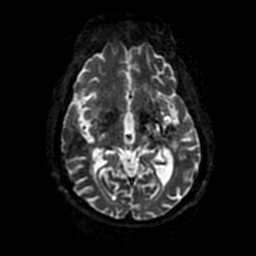
[im 67/100]
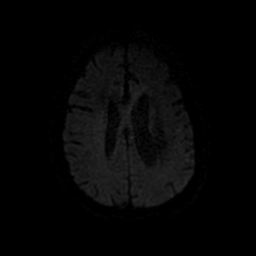
[im 83/100]
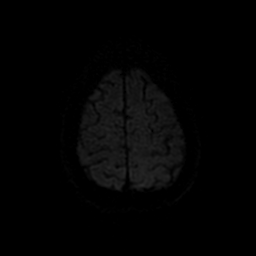
[im 100/100]
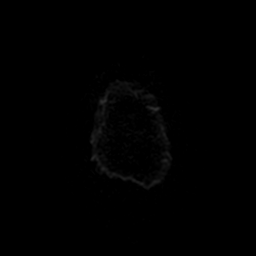

[Series 6: FLAIR · axial · 5.0mm · 0.47mm/px · z∈[-110,+25]mm · 2 of 25 slices shown (1 of 3)]
[im 1/25]
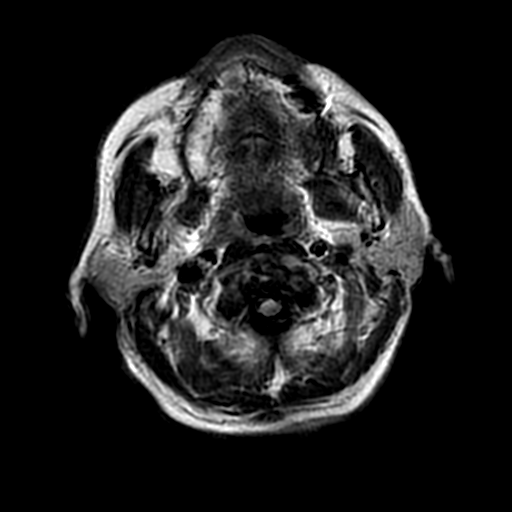
[im 25/25]
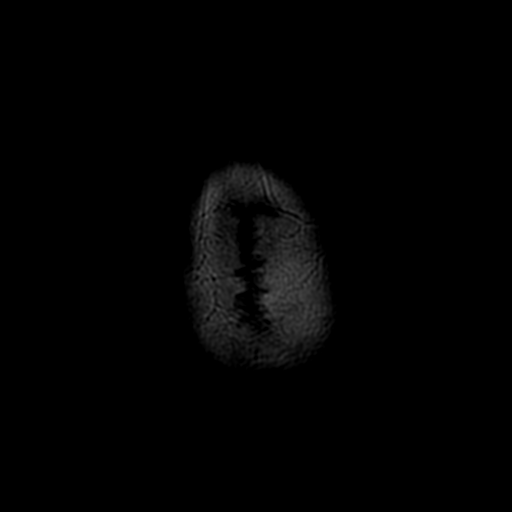

[Series 9: T2 · axial · 5.0mm · 0.47mm/px · z∈[-110,+25]mm · 2 of 25 slices shown (1 of 2)]
[im 1/25]
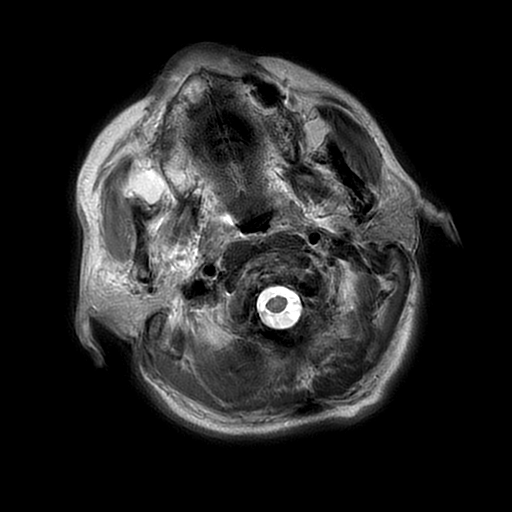
[im 25/25]
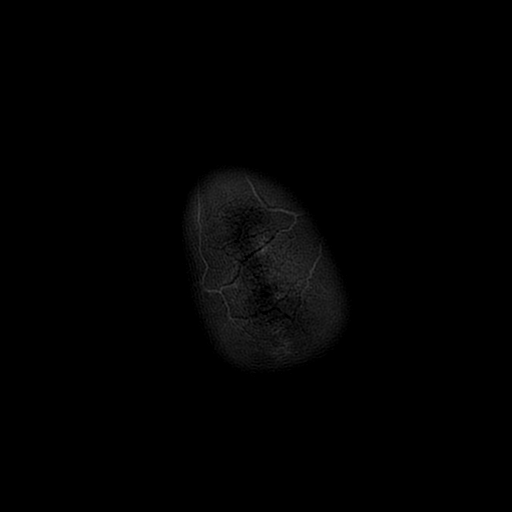

[Series 10: FLAIR · oblique · 5.0mm · 0.47mm/px · 2 of 23 slices shown (2 of 3)]
[im 1/23]
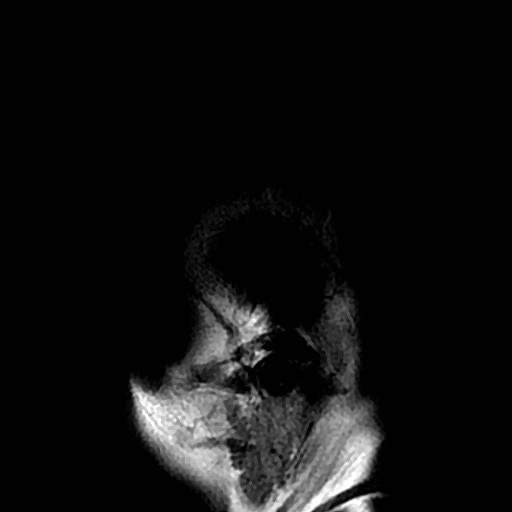
[im 23/23]
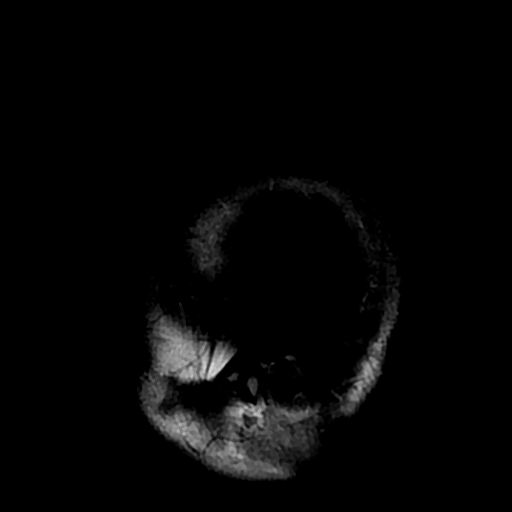

[Series 11: GRE · axial · 5.0mm · 0.43mm/px · z∈[-100,+34]mm · 2 of 27 slices shown]
[im 1/27]
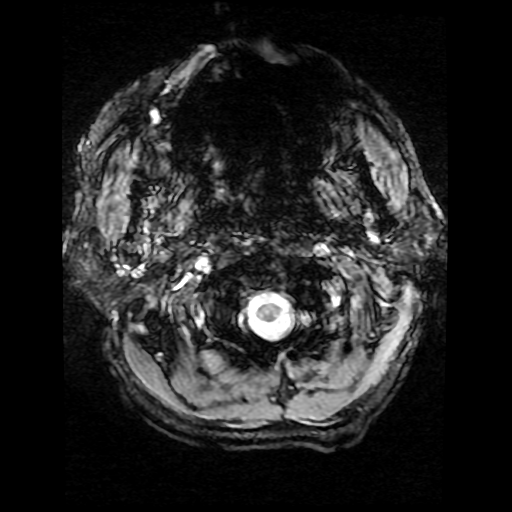
[im 27/27]
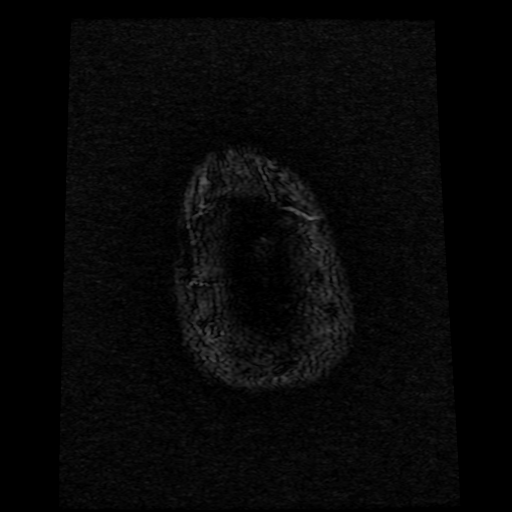

[Series 12: DWI · oblique · 4.0mm · 0.94mm/px · 5 of 72 slices shown (2 of 2)]
[im 1/72]
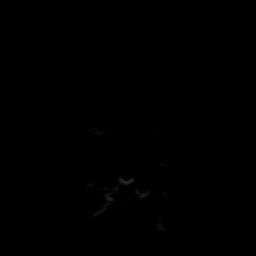
[im 18/72]
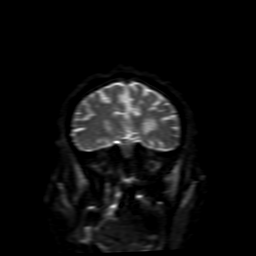
[im 36/72]
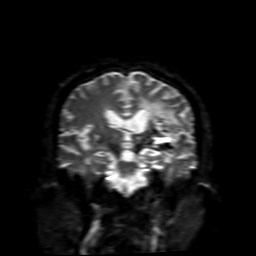
[im 54/72]
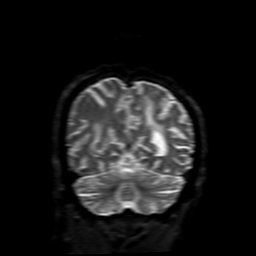
[im 72/72]
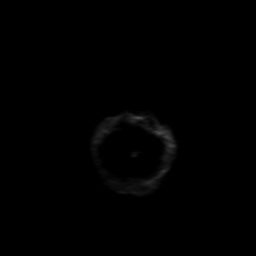

[Series 14: T2 · coronal · 5.0mm · 0.47mm/px · 2 of 30 slices shown (2 of 2)]
[im 1/30]
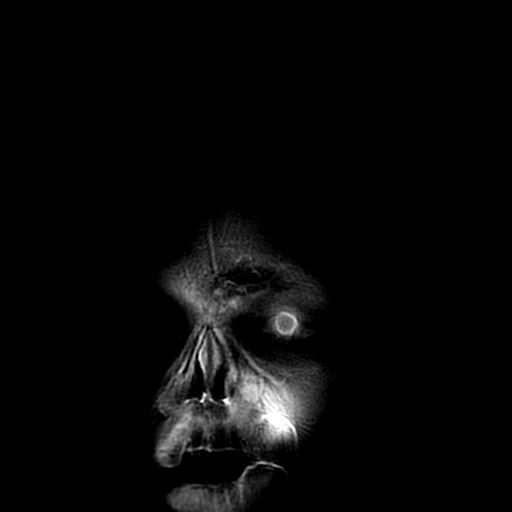
[im 30/30]
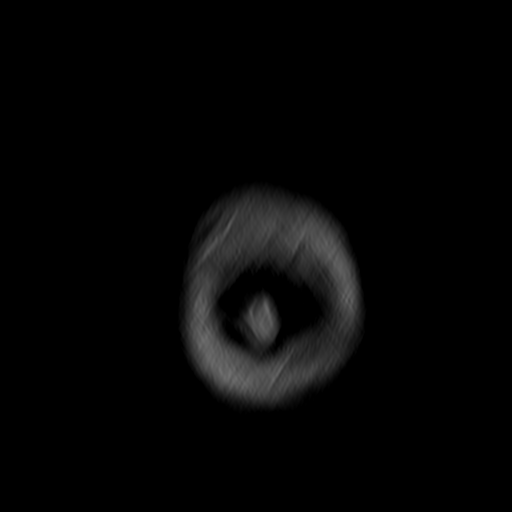

[Series 15: FLAIR · axial · 5.0mm · 0.47mm/px · z∈[-103,+32]mm · 2 of 25 slices shown (3 of 3)]
[im 1/25]
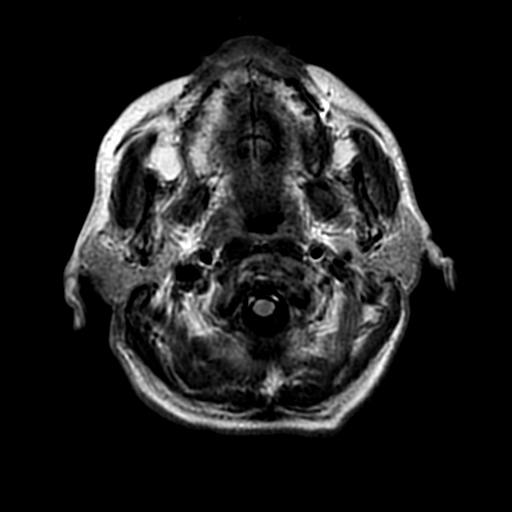
[im 25/25]
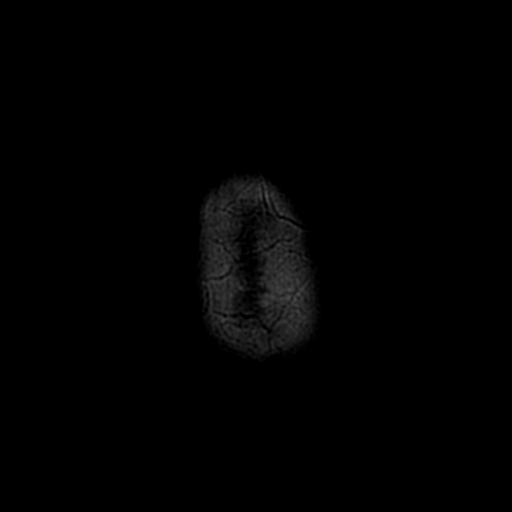

[Series 450: ADC · axial · 3.0mm · 0.94mm/px · z∈[-111,+27]mm · 4 of 50 slices shown (1 of 2)]
[im 1/50]
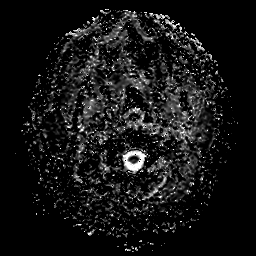
[im 17/50]
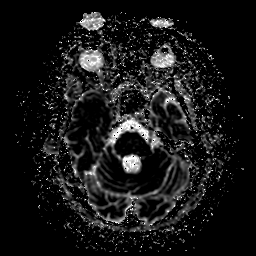
[im 33/50]
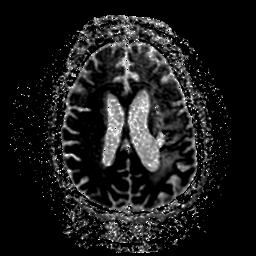
[im 50/50]
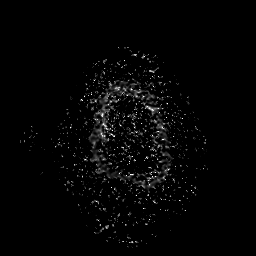

[Series 1250: ADC · oblique · 4.0mm · 0.94mm/px · 3 of 36 slices shown (2 of 2)]
[im 1/36]
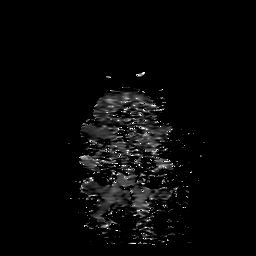
[im 18/36]
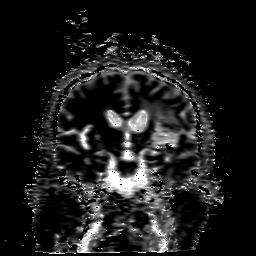
[im 36/36]
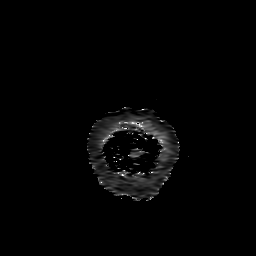

[31 of 48 positions shown; findings below may reference images not displayed]

FINDINGS: MRI HEAD FINDINGS

Brain: The midline structures are normal. There is a small focus of
diffusion restriction measuring approximately 8 mm within the right
pons. No other areas of diffusion restriction. There is
encephalomalacia at the site of old left MCA infarct. There are
multiple old bilateral lacunar infarcts. There is a large amount of
hyperintense T2 weighted signal within the left frontoparietal white
matter. There is also a small old right cerebellar infarct. There is
a punctate focus of petechial hemorrhage within the right pons.
There is hemosiderin deposition in the left MCA distribution. No
mass lesion. No chronic microhemorrhage or cerebral amyloid
angiopathy. No hydrocephalus, age advanced atrophy or lobar
predominant volume loss. No dural abnormality or extra-axial
collection.

Skull and upper cervical spine: The visualized skull base,
calvarium, upper cervical spine and extracranial soft tissues are
normal.

Sinuses/Orbits: No fluid levels or advanced mucosal thickening. No
mastoid effusion. Normal orbits.

MRA HEAD FINDINGS

Intracranial internal carotid arteries: Normal.

Anterior cerebral arteries: Normal.

Middle cerebral arteries: Normal.

Posterior communicating arteries: Present bilaterally.

Posterior cerebral arteries: Normal.  Fetal origin on the left.

Basilar artery: Normal.

Vertebral arteries: Codominant. Normal.

Superior cerebellar arteries: Normal.

Anterior inferior cerebellar arteries: Normal.

Posterior inferior cerebellar arteries: Normal.
IMPRESSION: 1. Acute infarct within the right pons, in keeping with left-sided
weakness. Punctate focus of nearby microhemorrhage seen on gradient
echo imaging is suspected to be chronic. Regardless, no
space-occupying hematoma.
2. Multiple old infarcts, including large left MCA distribution
infarct, with associated encephalomalacia.
3. Normal intracranial MRA.
